# Patient Record
Sex: Male | Born: 1995 | Race: Black or African American | Hispanic: No | Marital: Single | Smoking: Never smoker
Health system: Southern US, Community
[De-identification: ages and names within clinical notes are randomized; demographics above are authoritative.]

## PROBLEM LIST (undated history)

## (undated) DIAGNOSIS — E119 Type 2 diabetes mellitus without complications: Secondary | ICD-10-CM

---
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
## 2018-04-01 ENCOUNTER — Emergency Department (HOSPITAL_COMMUNITY): Payer: 59

## 2018-04-01 ENCOUNTER — Other Ambulatory Visit: Payer: Self-pay

## 2018-04-01 ENCOUNTER — Ambulatory Visit: Payer: BLUE CROSS/BLUE SHIELD | Admitting: Physician Assistant

## 2018-04-01 ENCOUNTER — Inpatient Hospital Stay (HOSPITAL_COMMUNITY)
Admission: EM | Admit: 2018-04-01 | Discharge: 2018-04-09 | DRG: 439 | Disposition: A | Discharge status: Home / Self Care | Payer: 59 | Attending: Internal Medicine | Admitting: Internal Medicine

## 2018-04-01 ENCOUNTER — Encounter (HOSPITAL_COMMUNITY): Payer: Self-pay | Admitting: Obstetrics and Gynecology

## 2018-04-01 DIAGNOSIS — R1013 Epigastric pain: Secondary | ICD-10-CM | POA: Diagnosis not present

## 2018-04-01 DIAGNOSIS — R739 Hyperglycemia, unspecified: Secondary | ICD-10-CM

## 2018-04-01 DIAGNOSIS — E1065 Type 1 diabetes mellitus with hyperglycemia: Secondary | ICD-10-CM | POA: Diagnosis not present

## 2018-04-01 DIAGNOSIS — R809 Proteinuria, unspecified: Secondary | ICD-10-CM | POA: Diagnosis present

## 2018-04-01 DIAGNOSIS — K810 Acute cholecystitis: Secondary | ICD-10-CM | POA: Diagnosis not present

## 2018-04-01 DIAGNOSIS — E785 Hyperlipidemia, unspecified: Secondary | ICD-10-CM | POA: Diagnosis present

## 2018-04-01 DIAGNOSIS — E109 Type 1 diabetes mellitus without complications: Secondary | ICD-10-CM | POA: Diagnosis not present

## 2018-04-01 DIAGNOSIS — R9431 Abnormal electrocardiogram [ECG] [EKG]: Secondary | ICD-10-CM | POA: Diagnosis present

## 2018-04-01 DIAGNOSIS — N390 Urinary tract infection, site not specified: Secondary | ICD-10-CM | POA: Diagnosis present

## 2018-04-01 DIAGNOSIS — E781 Pure hyperglyceridemia: Secondary | ICD-10-CM | POA: Diagnosis present

## 2018-04-01 DIAGNOSIS — R110 Nausea: Secondary | ICD-10-CM | POA: Diagnosis not present

## 2018-04-01 DIAGNOSIS — K859 Acute pancreatitis without necrosis or infection, unspecified: Secondary | ICD-10-CM | POA: Diagnosis present

## 2018-04-01 DIAGNOSIS — Z79899 Other long term (current) drug therapy: Secondary | ICD-10-CM | POA: Diagnosis not present

## 2018-04-01 DIAGNOSIS — R509 Fever, unspecified: Secondary | ICD-10-CM

## 2018-04-01 DIAGNOSIS — E119 Type 2 diabetes mellitus without complications: Secondary | ICD-10-CM

## 2018-04-01 DIAGNOSIS — E1069 Type 1 diabetes mellitus with other specified complication: Secondary | ICD-10-CM

## 2018-04-01 DIAGNOSIS — R1011 Right upper quadrant pain: Secondary | ICD-10-CM | POA: Diagnosis not present

## 2018-04-01 DIAGNOSIS — R945 Abnormal results of liver function studies: Secondary | ICD-10-CM

## 2018-04-01 DIAGNOSIS — M609 Myositis, unspecified: Secondary | ICD-10-CM | POA: Diagnosis present

## 2018-04-01 DIAGNOSIS — R6510 Systemic inflammatory response syndrome (SIRS) of non-infectious origin without acute organ dysfunction: Secondary | ICD-10-CM

## 2018-04-01 DIAGNOSIS — R7989 Other specified abnormal findings of blood chemistry: Secondary | ICD-10-CM

## 2018-04-01 DIAGNOSIS — Z833 Family history of diabetes mellitus: Secondary | ICD-10-CM | POA: Diagnosis not present

## 2018-04-01 DIAGNOSIS — K858 Other acute pancreatitis without necrosis or infection: Secondary | ICD-10-CM | POA: Diagnosis not present

## 2018-04-01 DIAGNOSIS — M4056 Lordosis, unspecified, lumbar region: Secondary | ICD-10-CM | POA: Diagnosis not present

## 2018-04-01 LAB — CBC
HEMATOCRIT: 43.2 % (ref 39.0–52.0)
HEMOGLOBIN: 15.7 g/dL (ref 13.0–17.0)
MCH: 29.7 pg (ref 26.0–34.0)
MCHC: 35.8 g/dL (ref 30.0–36.0)
MCV: 83.9 fL (ref 78.0–100.0)
Platelets: 279 10*3/uL (ref 150–400)
RBC: 5.15 MIL/uL (ref 4.22–5.81)
RDW: 12.9 % (ref 11.5–15.5)
WBC: 15.7 10*3/uL — AB (ref 4.0–10.5)

## 2018-04-01 LAB — COMPREHENSIVE METABOLIC PANEL
ALT: 78 U/L — ABNORMAL HIGH (ref 0–44)
ANION GAP: 16 — AB (ref 5–15)
AST: 31 U/L (ref 15–41)
Albumin: 4.5 g/dL (ref 3.5–5.0)
Alkaline Phosphatase: 145 U/L — ABNORMAL HIGH (ref 38–126)
BUN: 6 mg/dL (ref 6–20)
CHLORIDE: 96 mmol/L — AB (ref 98–111)
CO2: 25 mmol/L (ref 22–32)
Calcium: 10.5 mg/dL — ABNORMAL HIGH (ref 8.9–10.3)
Creatinine, Ser: 0.78 mg/dL (ref 0.61–1.24)
Glucose, Bld: 339 mg/dL — ABNORMAL HIGH (ref 70–99)
POTASSIUM: 4 mmol/L (ref 3.5–5.1)
Sodium: 137 mmol/L (ref 135–145)
Total Bilirubin: 0.6 mg/dL (ref 0.3–1.2)
Total Protein: 8.2 g/dL — ABNORMAL HIGH (ref 6.5–8.1)

## 2018-04-01 LAB — URINALYSIS, ROUTINE W REFLEX MICROSCOPIC
Bilirubin Urine: NEGATIVE
Glucose, UA: >=500 mg/dL — AB
HGB URINE DIPSTICK: NEGATIVE
Ketones, ur: 80 mg/dL — AB
LEUKOCYTES UA: NEGATIVE
Nitrite: NEGATIVE
SPECIFIC GRAVITY, URINE: 1.044 — AB (ref 1.005–1.030)
pH: 6 (ref 5.0–8.0)

## 2018-04-01 LAB — I-STAT CG4 LACTIC ACID, ED: LACTIC ACID, VENOUS: 2.64 mmol/L — AB (ref 0.5–1.9)

## 2018-04-01 LAB — LIPASE, BLOOD: LIPASE: 109 U/L — AB (ref 11–51)

## 2018-04-01 LAB — CBG MONITORING, ED: Glucose-Capillary: 316 mg/dL — ABNORMAL HIGH (ref 70–99)

## 2018-04-01 MED ORDER — HYDROMORPHONE HCL 1 MG/ML IJ SOLN
1.0000 mg | INTRAMUSCULAR | Status: DC | PRN
  Administered 2018-04-02 – 2018-04-08 (×7): 1 mg via INTRAVENOUS
  Filled 2018-04-01 (×7): qty 1

## 2018-04-01 MED ORDER — SODIUM CHLORIDE 0.9 % IV SOLN
INTRAVENOUS | Status: AC
  Administered 2018-04-02 (×4): via INTRAVENOUS

## 2018-04-01 MED ORDER — ACETAMINOPHEN 650 MG RE SUPP: 650.0000 mg | Freq: Four times a day (QID) | RECTAL | Status: DC | PRN

## 2018-04-01 MED ORDER — ONDANSETRON HCL 4 MG/2ML IJ SOLN: 4.0000 mg | Freq: Four times a day (QID) | INTRAMUSCULAR | Status: DC | PRN

## 2018-04-01 MED ORDER — IOPAMIDOL (ISOVUE-300) INJECTION 61%
INTRAVENOUS | Status: AC
  Filled 2018-04-01: qty 100

## 2018-04-01 MED ORDER — ONDANSETRON HCL 4 MG/2ML IJ SOLN
4.0000 mg | Freq: Once | INTRAMUSCULAR | Status: AC
  Administered 2018-04-01: 4 mg via INTRAVENOUS
  Filled 2018-04-01: qty 2

## 2018-04-01 MED ORDER — ACETAMINOPHEN 325 MG PO TABS
650.0000 mg | ORAL_TABLET | Freq: Four times a day (QID) | ORAL | Status: DC | PRN
  Administered 2018-04-02 – 2018-04-09 (×10): 650 mg via ORAL
  Filled 2018-04-01 (×10): qty 2

## 2018-04-01 MED ORDER — SODIUM CHLORIDE 0.9 % IV SOLN
1.0000 g | INTRAVENOUS | Status: DC
  Administered 2018-04-02: 1 g via INTRAVENOUS
  Filled 2018-04-01: qty 10

## 2018-04-01 MED ORDER — IOPAMIDOL (ISOVUE-300) INJECTION 61%
100.0000 mL | Freq: Once | INTRAVENOUS | Status: AC | PRN
  Administered 2018-04-01: 100 mL via INTRAVENOUS

## 2018-04-01 MED ORDER — ENOXAPARIN SODIUM 40 MG/0.4ML ~~LOC~~ SOLN
40.0000 mg | SUBCUTANEOUS | Status: DC
  Administered 2018-04-02 – 2018-04-07 (×6): 40 mg via SUBCUTANEOUS
  Filled 2018-04-01 (×8): qty 0.4

## 2018-04-01 MED ORDER — SODIUM CHLORIDE 0.9 % IV BOLUS
1000.0000 mL | Freq: Once | INTRAVENOUS | Status: AC
  Administered 2018-04-01: 1000 mL via INTRAVENOUS

## 2018-04-01 MED ORDER — INSULIN ASPART 100 UNIT/ML ~~LOC~~ SOLN
0.0000 [IU] | SUBCUTANEOUS | Status: DC
  Administered 2018-04-02 (×2): 5 [IU] via SUBCUTANEOUS
  Administered 2018-04-02: 2 [IU] via SUBCUTANEOUS
  Administered 2018-04-02: 3 [IU] via SUBCUTANEOUS
  Administered 2018-04-02: 7 [IU] via SUBCUTANEOUS
  Administered 2018-04-02: 3 [IU] via SUBCUTANEOUS
  Administered 2018-04-03 (×2): 2 [IU] via SUBCUTANEOUS
  Administered 2018-04-03: 3 [IU] via SUBCUTANEOUS
  Administered 2018-04-03 (×2): 2 [IU] via SUBCUTANEOUS
  Administered 2018-04-03: 3 [IU] via SUBCUTANEOUS
  Administered 2018-04-04: 1 [IU] via SUBCUTANEOUS
  Administered 2018-04-04: 2 [IU] via SUBCUTANEOUS
  Administered 2018-04-04: 3 [IU] via SUBCUTANEOUS
  Administered 2018-04-05: 1 [IU] via SUBCUTANEOUS
  Administered 2018-04-05: 2 [IU] via SUBCUTANEOUS
  Administered 2018-04-05: 1 [IU] via SUBCUTANEOUS
  Administered 2018-04-05: 2 [IU] via SUBCUTANEOUS
  Administered 2018-04-05 – 2018-04-06 (×2): 1 [IU] via SUBCUTANEOUS
  Administered 2018-04-06: 2 [IU] via SUBCUTANEOUS
  Administered 2018-04-06: 1 [IU] via SUBCUTANEOUS
  Administered 2018-04-06 (×2): 2 [IU] via SUBCUTANEOUS
  Administered 2018-04-07: 1 [IU] via SUBCUTANEOUS
  Administered 2018-04-07 (×3): 2 [IU] via SUBCUTANEOUS
  Administered 2018-04-08 (×2): 1 [IU] via SUBCUTANEOUS
  Administered 2018-04-08: 2 [IU] via SUBCUTANEOUS
  Administered 2018-04-09: 1 [IU] via SUBCUTANEOUS
  Filled 2018-04-01: qty 1

## 2018-04-01 MED ORDER — MORPHINE SULFATE (PF) 4 MG/ML IV SOLN
4.0000 mg | Freq: Once | INTRAVENOUS | Status: AC
  Administered 2018-04-01: 4 mg via INTRAVENOUS
  Filled 2018-04-01: qty 1

## 2018-04-01 NOTE — ED Provider Notes (Signed)
[facility] DEPT Provider Note   CSN: [id number] Arrival date & time: 04/01/18  1738     History   Chief Complaint Chief Complaint  Patient presents with  . Abdominal Pain    HPI Alfred Li is a 22 y.o. male with no pmh here for evaluation of abdominal pain onset 2-3 days ago. Pain is crampy, intermittent, localized to above umbilicus.  Associated with sweats, chills, nausea, nbnb emesis x 1 today.  Went to UC and was given zofran, did not feel better and came to ER for evaluation. No hematemesis, diarrhea, constipation, dysuria, hematuria. No h/o abd surgeries. No ETOH or NSAID use. No h/o PUD.  Admits to recent dry mouth, polyuria, polydipsia.  No h/o diabetes. Aggravated with palpation and movements. No alleviating factors.    HPI  History reviewed. No pertinent past medical history.  There are no active problems to display for this patient.   History reviewed. No pertinent surgical history.      Home Medications    Prior to Admission medications   Medication Sig Start Date End Date Taking? Authorizing Provider  acetaminophen (TYLENOL) 500 MG tablet Take 1,000 mg by mouth every 6 (six) hours as needed for moderate pain.   Yes [provider]  loperamide (IMODIUM) 2 MG capsule Take 2 mg by mouth every 6 (six) hours as needed for diarrhea or loose stools.   Yes [provider]  ondansetron (ZOFRAN) 4 MG tablet Take 4 mg by mouth every 8 (eight) hours as needed for nausea or vomiting.   Yes [provider]  ranitidine (ZANTAC) 150 MG tablet Take 150 mg by mouth 2 (two) times daily.   Yes [provider]  amoxicillin (AMOXIL) 500 MG capsule Take 2 capsules (1,000 mg total) by mouth 2 (two) times daily. Patient not taking: Reported on 04/01/2018 12/22/15   Shawnee Knapp, MD  AYR SALINE NASAL NO-DRIP GEL Apply a small amount onto the lower septum of each nair every 2 hours Patient not taking: Reported on  04/01/2018 12/22/15   Shawnee Knapp, MD  mupirocin ointment (BACTROBAN) 2 % Apply 1 application topically 3 (three) times daily. Into each nare for 3 days Patient not taking: Reported on 04/01/2018 12/22/15   Shawnee Knapp, MD  oxymetazoline Melissa Montane NASAL SPRAY) 0.05 % nasal spray Use 3 sprays into nostrils as needed for nose bleed Patient not taking: Reported on 04/01/2018 12/22/15   Shawnee Knapp, MD  Pseudoephedrine-Guaifenesin [phone] MG TB12 Take 1 tablet by mouth 2 (two) times daily. Patient not taking: Reported on 04/01/2018 12/22/15   Shawnee Knapp, MD    Family History History reviewed. No pertinent family history.  Social History Social History   Tobacco Use  . Smoking status: Never Smoker  . Smokeless tobacco: Never Used  Substance Use Topics  . Alcohol use: No  . Drug use: No     Allergies   Patient has no known allergies.   Review of Systems Review of Systems  Constitutional: Positive for chills.  Gastrointestinal: Positive for abdominal pain, nausea and vomiting.  Endocrine: Positive for polydipsia and polyuria.  All other systems reviewed and are negative.    Physical Exam Updated Vital Signs BP 139/89   Pulse (!) 106   Temp 99.3 F (37.4 C) (Oral)   Resp 18   Ht _0  (1.778 m)   Wt 66.3 kg (146 lb 2 oz)   SpO2 99%   BMI 20.97 kg/m  Physical Exam  Constitutional: He is oriented to person, place, and time. He appears well-developed and well-nourished.  Non toxic.  HENT:  Head: Normocephalic and atraumatic.  Nose: Nose normal.  Moist mucous membranes   Eyes: Pupils are equal, round, and reactive to light. Conjunctivae and EOM are normal.  Neck: Normal range of motion.  Cardiovascular: Normal rate, regular rhythm, normal heart sounds and intact distal pulses.  2+ DP and radial pulses bilaterally.   Pulmonary/Chest: Effort normal and breath sounds normal.  Abdominal: Soft. Bowel sounds are normal. There is tenderness in the right upper quadrant, epigastric area  and periumbilical area. There is guarding.  Positive Murphy's. TTP to RUQ, epigastric and periumbilical regions with guarding. Active BS. No distention. No rigidity. No suprapubic or CVA tenderness.   Musculoskeletal: Normal range of motion.  Neurological: He is alert and oriented to person, place, and time.  Skin: Skin is warm and dry. Capillary refill takes less than 2 seconds.  Psychiatric: He has a normal mood and affect. His behavior is normal. Judgment and thought content normal.  Nursing note and vitals reviewed.    ED Treatments / Results  Labs (all labs ordered are listed, but only abnormal results are displayed) Labs Reviewed  LIPASE, BLOOD - Abnormal; Notable for the following components:      Result Value   Lipase 109 (*)    All other components within normal limits  COMPREHENSIVE METABOLIC PANEL - Abnormal; Notable for the following components:   Chloride 96 (*)    Glucose, Bld 339 (*)    Calcium 10.5 (*)    Total Protein 8.2 (*)    ALT 78 (*)    Alkaline Phosphatase 145 (*)    Anion gap 16 (*)    All other components within normal limits  CBC - Abnormal; Notable for the following components:   WBC 15.7 (*)    All other components within normal limits  URINALYSIS, ROUTINE W REFLEX MICROSCOPIC - Abnormal; Notable for the following components:   Specific Gravity, Urine 1.044 (*)    Glucose, UA >=500 (*)    Ketones, ur 80 (*)    Protein, ur >=300 (*)    Bacteria, UA RARE (*)    All other components within normal limits  I-STAT CG4 LACTIC ACID, ED - Abnormal; Notable for the following components:   Lactic Acid, Venous 2.64 (*)    All other components within normal limits  HEMOGLOBIN A1C  I-STAT CG4 LACTIC ACID, ED    EKG None  Radiology Ct Abdomen Pelvis W Contrast  Result Date: 04/01/2018 CLINICAL DATA:  Initial evaluation for acute right upper quadrant pain, fever. EXAM: CT ABDOMEN AND PELVIS WITH CONTRAST TECHNIQUE: Multidetector CT imaging of the abdomen  and pelvis was performed using the standard protocol following bolus administration of intravenous contrast. CONTRAST:  152m ISOVUE-300 IOPAMIDOL (ISOVUE-300) INJECTION 61% COMPARISON:  None. FINDINGS: Lower chest: Visualized lungs are clear. Hepatobiliary: Liver demonstrates a normal contrast enhanced appearance. Gallbladder within normal limits. No biliary dilatation. Pancreas: Hazy inflammatory stranding about the head and uncinate process of the pancreas, suggesting acute pancreatitis. No loculated peripancreatic collection or other complication identified. Spleen: Unremarkable. Adrenals/Urinary Tract: Adrenal glands are normal. Kidneys equal in size with symmetric enhancement. No nephrolithiasis, hydronephrosis, or focal enhancing renal mass. No hydroureter. Bladder within normal limits. Stomach/Bowel: Stomach within normal limits. Inflammatory stranding surrounds the duodenum due to the adjacent inflammatory process within the pancreas. No evidence for bowel obstruction. Normal appendix. No other acute inflammatory changes  seen about the bowels. Vascular/Lymphatic: Normal intravascular enhancement seen throughout the intra-abdominal aorta and its branch vessels. No adenopathy. Reproductive: Prostate normal. Other: No free air. Small volume free fluid within the upper abdomen related to the acute inflammatory changes about the pancreas. Musculoskeletal: No acute osseus abnormality. No worrisome lytic or blastic osseous lesions. IMPRESSION: 1. Findings consistent with acute pancreatitis. No complication identified. 2. No other acute abnormality within the abdomen and pelvis. Electronically Signed   By: Jeannine Boga M.D.   On: 04/01/2018 22:07    Procedures Procedures (including critical care time)  Medications Ordered in ED Medications  morphine 4 MG/ML injection 4 mg (4 mg Intravenous Given 04/01/18 2053)  ondansetron (ZOFRAN) injection 4 mg (4 mg Intravenous Given 04/01/18 2053)  sodium  chloride 0.9 % bolus 1,000 mL (0 mLs Intravenous Stopped 04/01/18 2240)  iopamidol (ISOVUE-300) 61 % injection 100 mL (100 mLs Intravenous Contrast Given 04/01/18 2146)     Initial Impression / Assessment and Plan / ED Course  I have reviewed the triage vital signs and the nursing notes.  Pertinent labs & imaging results that were available during my care of the patient were reviewed by me and considered in my medical decision making (see chart for details).  Clinical Course as of Apr 01 2302  Fri Apr 01, 2018  2030 WBC(!): 15.7 [CG]  2031 Lipase(!): 109 [CG]  2031 Glucose(!): 339 [CG]  2031 Anion gap(!): 16 [CG]  2031 Alkaline Phosphatase(!): 145 [CG]  2031 ALT(!): 78 [CG]  2031 Ketones, ur(!): 80 [CG]  2031 Protein(!): >=300 [CG]  2031 WBC, UA: 21-50 [CG]  2250 Lactic Acid, Venous(!!): 2.64 [CG]  2250 IMPRESSION: 1. Findings consistent with acute pancreatitis. No complication identified. 2. No other acute abnormality within the abdomen and pelvis.    CT ABDOMEN PELVIS W CONTRAST [CG]    Clinical Course User Index [CG] Kinnie Feil, PA-C   22 year old healthy male here with epigastric/right upper quadrant tenderness, nausea, vomiting, chills.  Admits to having cottonmouth, polyuria, polydipsia for several weeks.  No known history of diabetes.  Work-up remarkable for WBC 15.7, lipase 109, hypoglycemia with anion gap of 16 and bicarb 25, normal potassium.  ALT 78, alk phos 145.  Glucosuria, proteinuria, ketonuria.  CT A/P confirms acute pancreatitis without complication.  Gallbladder WNL.  Patient given morphine, Zofran, IV fluids.  Will not keep n.p.o.  Will request admission for pancreatitis, possible new onset diabetes. HA1c pending. Discussed plan with pt and mother who are in agreement.   Final Clinical Impressions(s) / ED Diagnoses   2300: Spoke to Dr. Maudie Mercury who will admit patient.  Final diagnoses:  Acute pancreatitis without infection or necrosis, unspecified  pancreatitis type  Hyperglycemia    ED Discharge Orders    None       Arlean Hopping 04/01/18 2304    Davonna Belling, MD 04/02/18 0040

## 2018-04-01 NOTE — H&P (Signed)
[facility] H&P   Patient Demographics:    Alfred Li, is a 22 y.o. male  MRN: [id number]   DOB - 1995/10/26  Admit Date - 04/01/2018  Outpatient Primary MD for the patient is Patient, No Pcp Per  Referring MD/NP/PA:  Carmon Sails  Outpatient Specialists:    Patient coming from: home  Chief Complaint  Patient presents with  . Abdominal Pain      HPI:    Alfred Li  is a 22 y.o. male, w family hx of diabetes, apparently presents with 4 days of epigastric pain.  Pt denies fever, chills, cp, palp, sob, diarrhea, brbpr.  Pt was seen at [facility] urgent care and tx with zantac earlier today but his abdominal pain worsened and therefore presented to ED for evaluation.  Notes n/v x1 today.  No bloody emesis.    In ED,   CT abd/ pelvis IMPRESSION: 1. Findings consistent with acute pancreatitis. No complication identified. 2. No other acute abnormality within the abdomen and pelvis.  Lipase 109  Glucose 339 Bun 6, Creatinine 0.78 Ast 31, Alt 78, Alk phos 145, T. Bili 0.6 Wbc 15.7, Hgb 15.7, Plt 279  Urinalysis wbc 21-50, rbc 0-5  Pt will be admitted for UTI, pancreatitis and new onset diabetes.     Review of systems:    In addition to the HPI above,  No Fever-chills, No Headache, No changes with Vision or hearing, No problems swallowing food or Liquids, No Chest pain, Cough or Shortness of Breath, No Blood in stool or Urine, No dysuria, No new skin rashes or bruises, No new joints pains-aches,  No new weakness, tingling, numbness in any extremity, No recent weight gain or loss, No polyuria, polydypsia or polyphagia, No significant Mental Stressors.  A full 10 point Review of Systems was done, except as stated above, all other Review of Systems were negative.   With Past History of the following :    History reviewed. No pertinent past medical history.    None per pt History reviewed. No pertinent surgical history. None per pt   Social History:     Social History   Tobacco Use  . Smoking status: Never Smoker  . Smokeless tobacco: Never Used  Substance Use Topics  . Alcohol use: No     Lives - at home  Mobility - walks by self   Family History :    History reviewed. No pertinent family history. + family history of diabetes    Home Medications:   Prior to Admission medications   Medication Sig Start Date End Date Taking? Authorizing Provider  acetaminophen (TYLENOL) 500 MG tablet Take 1,000 mg by mouth every 6 (six) hours as needed for moderate pain.   Yes [provider]  loperamide (IMODIUM) 2 MG capsule Take 2 mg by mouth every 6 (six) hours as needed for diarrhea or loose stools.  Yes [provider]  ondansetron (ZOFRAN) 4 MG tablet Take 4 mg by mouth every 8 (eight) hours as needed for nausea or vomiting.   Yes [provider]  ranitidine (ZANTAC) 150 MG tablet Take 150 mg by mouth 2 (two) times daily.   Yes [provider]  amoxicillin (AMOXIL) 500 MG capsule Take 2 capsules (1,000 mg total) by mouth 2 (two) times daily. Patient not taking: Reported on 04/01/2018 12/22/15   Shawnee Knapp, MD  AYR SALINE NASAL NO-DRIP GEL Apply a small amount onto the lower septum of each nair every 2 hours Patient not taking: Reported on 04/01/2018 12/22/15   Shawnee Knapp, MD  mupirocin ointment (BACTROBAN) 2 % Apply 1 application topically 3 (three) times daily. Into each nare for 3 days Patient not taking: Reported on 04/01/2018 12/22/15   Shawnee Knapp, MD  oxymetazoline Melissa Montane NASAL SPRAY) 0.05 % nasal spray Use 3 sprays into nostrils as needed for nose bleed Patient not taking: Reported on 04/01/2018 12/22/15   Shawnee Knapp, MD  Pseudoephedrine-Guaifenesin [phone] MG TB12 Take 1 tablet by mouth 2 (two) times daily. Patient not taking: Reported on 04/01/2018 12/22/15   Shawnee Knapp, MD     Allergies:    No  Known Allergies   Physical Exam:   Vitals  Blood pressure 139/89, pulse (!) 106, temperature 99.3 F (37.4 C), temperature source Oral, resp. rate 18, height _0  (1.778 m), weight 66.3 kg (146 lb 2 oz), SpO2 99 %.   1. General  lying in bed in NAD,   2. Normal affect and insight, Not Suicidal or Homicidal, Awake Alert, Oriented X 3.  3. No F.N deficits, ALL C.Nerves Intact, Strength 5/5 all 4 extremities, Sensation intact all 4 extremities, Plantars down going.  4. Ears and Eyes appear Normal, Conjunctivae clear, PERRLA. Moist Oral Mucosa.  5. Supple Neck, No JVD, No cervical lymphadenopathy appriciated, No Carotid Bruits.  6. Symmetrical Chest wall movement, Good air movement bilaterally, CTAB.  7. RRR, No Gallops, Rubs or Murmurs, No Parasternal Heave.  8. Positive Bowel Sounds, Abdomen Soft, No tenderness, No organomegaly appriciated,No rebound -guarding or rigidity.  9.  No Cyanosis, Normal Skin Turgor, No Skin Rash or Bruise.  10. Good muscle tone,  joints appear normal , no effusions, Normal ROM.  11. No Palpable Lymph Nodes in Neck or Axillae      Data Review:    CBC Recent Labs  Lab 04/01/18 1803  WBC 15.7*  HGB 15.7  HCT 43.2  PLT 279  MCV 83.9  MCH 29.7  MCHC 35.8  RDW 12.9   ------------------------------------------------------------------------------------------------------------------  Chemistries  Recent Labs  Lab 04/01/18 1803  NA 137  K 4.0  CL 96*  CO2 25  GLUCOSE 339*  BUN 6  CREATININE 0.78  CALCIUM 10.5*  AST 31  ALT 78*  ALKPHOS 145*  BILITOT 0.6   ------------------------------------------------------------------------------------------------------------------ estimated creatinine clearance is 139.3 mL/min (by C-G formula based on SCr of 0.78 mg/dL). ------------------------------------------------------------------------------------------------------------------ No results for input(s): TSH, T4TOTAL, T3FREE, THYROIDAB  in the last 72 hours.  Invalid input(s): FREET3  Coagulation profile No results for input(s): INR, PROTIME in the last 168 hours. ------------------------------------------------------------------------------------------------------------------- No results for input(s): DDIMER in the last 72 hours. -------------------------------------------------------------------------------------------------------------------  Cardiac Enzymes No results for input(s): CKMB, TROPONINI, MYOGLOBIN in the last 168 hours.  Invalid input(s): CK ------------------------------------------------------------------------------------------------------------------ No results found for: BNP   ---------------------------------------------------------------------------------------------------------------  Urinalysis    Component Value Date/Time   COLORURINE YELLOW  04/01/2018 1911   APPEARANCEUR CLEAR 04/01/2018 1911   LABSPEC 1.044 (H) 04/01/2018 1911   PHURINE 6.0 04/01/2018 1911   GLUCOSEU >=500 (A) 04/01/2018 1911   HGBUR NEGATIVE 04/01/2018 1911   BILIRUBINUR NEGATIVE 04/01/2018 1911   KETONESUR 80 (A) 04/01/2018 1911   PROTEINUR >=300 (A) 04/01/2018 1911   UROBILINOGEN 0.2 04/08/2011 1831   NITRITE NEGATIVE 04/01/2018 1911   LEUKOCYTESUR NEGATIVE 04/01/2018 1911    ----------------------------------------------------------------------------------------------------------------   Imaging Results:    Ct Abdomen Pelvis W Contrast  Result Date: 04/01/2018 CLINICAL DATA:  Initial evaluation for acute right upper quadrant pain, fever. EXAM: CT ABDOMEN AND PELVIS WITH CONTRAST TECHNIQUE: Multidetector CT imaging of the abdomen and pelvis was performed using the standard protocol following bolus administration of intravenous contrast. CONTRAST:  17m ISOVUE-300 IOPAMIDOL (ISOVUE-300) INJECTION 61% COMPARISON:  None. FINDINGS: Lower chest: Visualized lungs are clear. Hepatobiliary: Liver demonstrates a  normal contrast enhanced appearance. Gallbladder within normal limits. No biliary dilatation. Pancreas: Hazy inflammatory stranding about the head and uncinate process of the pancreas, suggesting acute pancreatitis. No loculated peripancreatic collection or other complication identified. Spleen: Unremarkable. Adrenals/Urinary Tract: Adrenal glands are normal. Kidneys equal in size with symmetric enhancement. No nephrolithiasis, hydronephrosis, or focal enhancing renal mass. No hydroureter. Bladder within normal limits. Stomach/Bowel: Stomach within normal limits. Inflammatory stranding surrounds the duodenum due to the adjacent inflammatory process within the pancreas. No evidence for bowel obstruction. Normal appendix. No other acute inflammatory changes seen about the bowels. Vascular/Lymphatic: Normal intravascular enhancement seen throughout the intra-abdominal aorta and its branch vessels. No adenopathy. Reproductive: Prostate normal. Other: No free air. Small volume free fluid within the upper abdomen related to the acute inflammatory changes about the pancreas. Musculoskeletal: No acute osseus abnormality. No worrisome lytic or blastic osseous lesions. IMPRESSION: 1. Findings consistent with acute pancreatitis. No complication identified. 2. No other acute abnormality within the abdomen and pelvis. Electronically Signed   By: BJeannine BogaM.D.   On: 04/01/2018 22:07       Assessment & Plan:    Active Problems:   Pancreatitis    Abdominal pain, Pancreatitis Check LDH, check Lipid Check UDS NPO Ns iv Dilaudid 114miv q4h prn  Protonix 4044mo qday Check cbc, cmp, lipase in am  N/v Zofran 4mg8m q6h prn   Diabetes type 1 fsbs q4h, ISS  Abnormal liver function Check acute hepatitis panel Check cmp in am  UTI Await urine culture Rocephin 1gm iv qday    DVT Prophylaxis Lovenox - SCDs  AM Labs Ordered, also please review Full Orders  Family Communication: Admission,  patients condition and plan of care including tests being ordered have been discussed with the patient  who indicate understanding and agree with the plan and Code Status.  Code Status FULL CODE  Likely DC to  home  Condition GUARDED    Consults called: none  Admission status:  inpatient   Time spent in minutes : 60   JameJani Gravel on 04/01/2018 at 11:30 PM  Between 7am to 7pm - Pager - [phone] 7pm go to [email or web address] - password [email or web address] - Office  [phone]

## 2018-04-01 NOTE — ED Notes (Signed)
Admit MD at bedside

## 2018-04-01 NOTE — ED Notes (Signed)
Notified PA, Claudia pt. I-stat CG4 Lactic acid 2.64 and RN,Sydney made aware.

## 2018-04-01 NOTE — ED Triage Notes (Signed)
Per Pt: Pt reports stomach pain and cramping in his umbilicus region. Pt reports N/V/D. Pt reports a decrease in appetite.

## 2018-04-02 ENCOUNTER — Encounter (HOSPITAL_COMMUNITY): Payer: Self-pay | Admitting: *Deleted

## 2018-04-02 DIAGNOSIS — R945 Abnormal results of liver function studies: Secondary | ICD-10-CM

## 2018-04-02 DIAGNOSIS — K858 Other acute pancreatitis without necrosis or infection: Secondary | ICD-10-CM

## 2018-04-02 DIAGNOSIS — E119 Type 2 diabetes mellitus without complications: Secondary | ICD-10-CM

## 2018-04-02 DIAGNOSIS — R7989 Other specified abnormal findings of blood chemistry: Secondary | ICD-10-CM

## 2018-04-02 DIAGNOSIS — E781 Pure hyperglyceridemia: Secondary | ICD-10-CM

## 2018-04-02 DIAGNOSIS — R6510 Systemic inflammatory response syndrome (SIRS) of non-infectious origin without acute organ dysfunction: Secondary | ICD-10-CM

## 2018-04-02 LAB — COMPREHENSIVE METABOLIC PANEL
ALBUMIN: 4.1 g/dL (ref 3.5–5.0)
ALT: 62 U/L — ABNORMAL HIGH (ref 0–44)
AST: 22 U/L (ref 15–41)
Alkaline Phosphatase: 125 U/L (ref 38–126)
Anion gap: 14 (ref 5–15)
BUN: <5 mg/dL — ABNORMAL LOW (ref 6–20)
CO2: 24 mmol/L (ref 22–32)
Calcium: 9.7 mg/dL (ref 8.9–10.3)
Chloride: 98 mmol/L (ref 98–111)
Creatinine, Ser: 0.71 mg/dL (ref 0.61–1.24)
GFR calc Af Amer: >60 mL/min (ref >60)
GFR calc non Af Amer: >60 mL/min (ref >60)
GLUCOSE: 298 mg/dL — AB (ref 70–99)
POTASSIUM: 4.1 mmol/L (ref 3.5–5.1)
Sodium: 136 mmol/L (ref 135–145)
Total Bilirubin: 0.4 mg/dL (ref 0.3–1.2)
Total Protein: 7.6 g/dL (ref 6.5–8.1)

## 2018-04-02 LAB — GLUCOSE, CAPILLARY
GLUCOSE-CAPILLARY: 265 mg/dL — AB (ref 70–99)
GLUCOSE-CAPILLARY: 220 mg/dL — AB (ref 70–99)
Glucose-Capillary: 267 mg/dL — ABNORMAL HIGH (ref 70–99)
Glucose-Capillary: 220 mg/dL — ABNORMAL HIGH (ref 70–99)
Glucose-Capillary: 186 mg/dL — ABNORMAL HIGH (ref 70–99)

## 2018-04-02 LAB — LIPID PANEL
Cholesterol: 935 mg/dL — ABNORMAL HIGH (ref 0–200)
LDL Cholesterol: UNDETERMINED mg/dL (ref 0–99)
Triglycerides: >5000 mg/dL — ABNORMAL HIGH (ref <150)
VLDL: UNDETERMINED mg/dL (ref 0–40)

## 2018-04-02 LAB — CBC
HCT: 42.8 % (ref 39.0–52.0)
Hemoglobin: 15.2 g/dL (ref 13.0–17.0)
MCH: 29.5 pg (ref 26.0–34.0)
MCHC: 35.5 g/dL (ref 30.0–36.0)
MCV: 82.9 fL (ref 78.0–100.0)
Platelets: 269 10*3/uL (ref 150–400)
RBC: 5.16 MIL/uL (ref 4.22–5.81)
RDW: 13.1 % (ref 11.5–15.5)
WBC: 14.6 10*3/uL — ABNORMAL HIGH (ref 4.0–10.5)

## 2018-04-02 LAB — RAPID URINE DRUG SCREEN, HOSP PERFORMED
AMPHETAMINES: NOT DETECTED
BENZODIAZEPINES: NOT DETECTED
Barbiturates: NOT DETECTED
Cocaine: NOT DETECTED
OPIATES: POSITIVE — AB
Tetrahydrocannabinol: NOT DETECTED

## 2018-04-02 LAB — HEMOGLOBIN A1C
Hgb A1c MFr Bld: 13.9 % — ABNORMAL HIGH (ref 4.8–5.6)
Mean Plasma Glucose: 352.23 mg/dL

## 2018-04-02 LAB — LIPASE, BLOOD: LIPASE: 86 U/L — AB (ref 11–51)

## 2018-04-02 LAB — HIV ANTIBODY (ROUTINE TESTING W REFLEX): HIV Screen 4th Generation wRfx: NONREACTIVE

## 2018-04-02 LAB — LACTATE DEHYDROGENASE: LDH: 154 U/L (ref 98–192)

## 2018-04-02 MED ORDER — INSULIN GLARGINE 100 UNIT/ML ~~LOC~~ SOLN
20.0000 [IU] | Freq: Every day | SUBCUTANEOUS | Status: DC
  Administered 2018-04-02: 20 [IU] via SUBCUTANEOUS
  Filled 2018-04-02 (×2): qty 0.2

## 2018-04-02 MED ORDER — ATORVASTATIN CALCIUM 40 MG PO TABS
40.0000 mg | ORAL_TABLET | Freq: Every day | ORAL | Status: DC
Start: 2018-04-02 — End: 2018-04-08
  Administered 2018-04-02 – 2018-04-07 (×6): 40 mg via ORAL
  Filled 2018-04-02 (×6): qty 1

## 2018-04-02 MED ORDER — INSULIN STARTER KIT- PEN NEEDLES (ENGLISH)
1.0000 | Freq: Once | Status: AC
  Administered 2018-04-02: 1
  Filled 2018-04-02: qty 1

## 2018-04-02 MED ORDER — GEMFIBROZIL 600 MG PO TABS
600.0000 mg | ORAL_TABLET | Freq: Two times a day (BID) | ORAL | Status: DC
  Administered 2018-04-02 – 2018-04-09 (×15): 600 mg via ORAL
  Filled 2018-04-02 (×16): qty 1

## 2018-04-02 MED ORDER — OMEGA-3-ACID ETHYL ESTERS 1 G PO CAPS
1.0000 g | ORAL_CAPSULE | Freq: Two times a day (BID) | ORAL | Status: DC
  Administered 2018-04-02 – 2018-04-09 (×15): 1 g via ORAL
  Filled 2018-04-02 (×15): qty 1

## 2018-04-02 MED ORDER — LIVING WELL WITH DIABETES BOOK
Freq: Once | Status: AC
Start: 2018-04-02 — End: 2018-04-02
  Administered 2018-04-02: 13:00:00
  Filled 2018-04-02: qty 1

## 2018-04-02 NOTE — Progress Notes (Addendum)
Inpatient Diabetes Program Recommendations  AACE/ADA: New Consensus Statement on Inpatient Glycemic Control (2015)  Target Ranges:  Prepandial:   less than 140 mg/dL      Peak postprandial:   less than 180 mg/dL (1-2 hours)      Critically ill patients:  140 - 180 mg/dL   Lab Results  Component Value Date   GLUCAP 316 (H) 04/01/2018   HGBA1C 13.9 (H) 04/01/2018    Review of Glycemic Control Results for Alfred Li, Alfred Li (MRN [id number]) as of 04/02/2018 10:40  Ref. Range 04/01/2018 23:24  Glucose-Capillary Latest Ref Range: 70 - 99 mg/dL 316 (H)   Diabetes history: new onset Outpatient Diabetes medications: none Current orders for Inpatient glycemic control:  Novolog 0-9 units Q4H, Lantus 20 units QD  Inpatient Diabetes Program Recommendations:    Noted addition of basal insulin. Will watch for trends.  Spoke with mother and patient by phone regarding new diagnosis of DM.   Reviewed patient's current A1c of 13.9%. Explained what a A1c is and what it measures. Also reviewed goal A1c with patient, importance of good glucose control @ home, and blood sugar goals. Reviewed patho of DM, need for insulin, role of pancreas, beta cell function, differences between short acting insulin vs long acting insulin, vascular changes that occur from poor control and long term comorbidites. Reviewed signs and symptoms of hypo vs hyper glycemia and interventions based on 15-15 rule.   Patient will need a meter and testing supplies at discharge.  Encouraged to check BS 3-4 times a day or when symptomatic.   Placed consult for dietitian to assist with carb counting. Ordered LWWDM and encouraged family to review together and write down questions. Educated on CGM vs meter kits. Discussed co-pay cards. Plan to provide on Monday. Ordered insulin pen kit and will need additional instruction for self injection. Patient will need a PCP. Encouraged to make an appointment and reach out on Monday prior to DC, and  educated on role of endocrinologist as an additional resource. Patient's mother plans to reach out regarding appointments for follow up. Outpatient education referral placed.  Spoke with RN to begin assisting patient with CBGs and self injections. She plans to continue with additional education and review survival skills throughout the weekend. At this time patient and family have no further questions.   Thanks, Bronson Curb, MSN, RNC-OB Diabetes Coordinator [phone] (8a-5p)

## 2018-04-02 NOTE — Plan of Care (Signed)
VSS, patients pain controlled with Dilaudid at this time, no nausea or vomiting.  Mother at bedside.  Beginning education provided to mother and patient regarding diabetes, short acting and long acting insulin, checking blood sugars at home and pancreatitis management.  Patient sleepy d/t pain medications at this time so most of teaching occurring with mother.

## 2018-04-02 NOTE — Progress Notes (Signed)
PROGRESS NOTE    Alfred Li  QQP:[id number] DOB: Jan 15, 1996 DOA: 04/01/2018 PCP: Patient, No Pcp Per     Brief Narrative:  Alfred Li is a 22 year old male with no previous medical history who presents with 4 days history of epigastric pain.  He admits to progressive pain that worsened yesterday.  One episode of vomiting.  He also admits to polydipsia and polyuria over the past few months.  In the emergency department, CT abdomen pelvis revealed findings consistent with acute pancreatitis.  Triglycerides were over 5000, hemoglobin A1c 13.9.  New events last 24 hours / Subjective: No issues overnight.  Using Dilaudid for pain control.  No nausea or vomiting.  Continues to have epigastric pain that he describes as a soreness.  Assessment & Plan:   Principal Problem:   Pancreatitis Active Problems:   SIRS (systemic inflammatory response syndrome) (HCC)   High triglycerides   Diabetes (HCC)   LFT elevation  SIRS secondary to acute pancreatitis, pancreatitis secondary to elevated triglycerides -Continue supportive care, NPO, IV fluid, pain control -CT A/P without gallstones. Denies alcohol use.  -Gemfibrozil, Lipitor, omega-3 ordered for triglyceridemia >5000  New diagnosis diabetes, uncontrolled with hyperglycemia -Hemoglobin A1c 13.9 -Diabetic coordinator consulted -Discussed with mom and patient that patient will need insulin on discharge home -Likely type 1 diabetes in this young 22 yo patient with BMI 21. Check C-peptide, glutamic acid antibody, Proinsulin -Sliding scale insulin. Add lantus   Elevated LFT -Improving, likely due to pancreatitis -Hepatitis panel pending   Doubt UTI -UA negative for leukocytes, nitrites, rare bacteria. Urine culture pending -Stop Rocephin  Abnormal EKG -EKGs reviewed independently. Sinus tachycardia with peaked T waves anterior leads and questionable T wave elevation lateral leads. Repeat EKG this morning with sinus  tachycardia, J-point elevation without significant ST elevation.  -No chest pain. Monitor on telemetry    DVT prophylaxis: Loveox Code Status: Full Family Communication: Mother at bedside Disposition Plan: Pending improvement   Consultants:   None  Procedures:   None   Antimicrobials:  Anti-infectives (From admission, onward)   Start     Dose/Rate Route Frequency Ordered Stop   04/01/18 2315  cefTRIAXone (ROCEPHIN) 1 g in sodium chloride 0.9 % 100 mL IVPB  Status:  Discontinued     1 g 200 mL/hr over 30 Minutes Intravenous Every 24 hours 04/01/18 2312 04/02/18 0727       Objective: Vitals:   04/02/18 0132 04/02/18 0557 04/02/18 0626 04/02/18 0653  BP: 127/73 129/75    Pulse: (!) 111 (!) 132 (!) 124   Resp: 16 16    Temp: 99.4 F (37.4 C) (!) 101.3 F (38.5 C)  99.9 F (37.7 C)  TempSrc: Oral   Oral  SpO2: 98% 97%    Weight:  67.7 kg (149 lb 4 oz)    Height:        Intake/Output Summary (Last 24 hours) at 04/02/2018 1059 Last data filed at 04/02/2018 0605 Gross per 24 hour  Intake 2063.33 ml  Output 550 ml  Net 1513.33 ml   Filed Weights   04/01/18 1750 04/02/18 0557  Weight: 66.3 kg (146 lb 2 oz) 67.7 kg (149 lb 4 oz)    Examination:  General exam: Appears calm and comfortable  Respiratory system: Clear to auscultation. Respiratory effort normal. Cardiovascular system: S1 & S2 heard, RRR. No JVD, murmurs, rubs, gallops or clicks. No pedal edema. Gastrointestinal system: Abdomen is nondistended, soft and TTP epigastric. No organomegaly or masses felt. Normal bowel  sounds heard. Central nervous system: Alert and oriented. No focal neurological deficits. Extremities: Symmetric 5 x 5 power. Skin: No rashes, lesions or ulcers Psychiatry: Judgement and insight appear normal. Mood & affect appropriate.   Data Reviewed: I have personally reviewed following labs and imaging studies  CBC: Recent Labs  Lab 04/01/18 1803 04/02/18 0124  WBC 15.7* 14.6*  HGB  15.7 15.2  HCT 43.2 42.8  MCV 83.9 82.9  PLT 279 967   Basic Metabolic Panel: Recent Labs  Lab 04/01/18 1803 04/02/18 0124  NA 137 136  K 4.0 4.1  CL 96* 98  CO2 25 24  GLUCOSE 339* 298*  BUN 6 <5*  CREATININE 0.78 0.71  CALCIUM 10.5* 9.7   GFR: Estimated Creatinine Clearance: 142.2 mL/min (by C-G formula based on SCr of 0.71 mg/dL). Liver Function Tests: Recent Labs  Lab 04/01/18 1803 04/02/18 0124  AST 31 22  ALT 78* 62*  ALKPHOS 145* 125  BILITOT 0.6 0.4  PROT 8.2* 7.6  ALBUMIN 4.5 4.1   Recent Labs  Lab 04/01/18 1803 04/02/18 0124  LIPASE 109* 86*   No results for input(s): AMMONIA in the last 168 hours. Coagulation Profile: No results for input(s): INR, PROTIME in the last 168 hours. Cardiac Enzymes: No results for input(s): CKTOTAL, CKMB, CKMBINDEX, TROPONINI in the last 168 hours. BNP (last 3 results) No results for input(s): PROBNP in the last 8760 hours. HbA1C: Recent Labs    04/01/18 2047  HGBA1C 13.9*   CBG: Recent Labs  Lab 04/01/18 2324  GLUCAP 316*   Lipid Profile: Recent Labs    04/02/18 0124  CHOL 935*  HDL NOT REPORTED DUE TO HIGH TRIGLYCERIDES  LDLCALC UNABLE TO CALCULATE IF TRIGLYCERIDE OVER 400 mg/dL  TRIG >5,000*  CHOLHDL NOT REPORTED DUE TO HIGH TRIGLYCERIDES   Thyroid Function Tests: No results for input(s): TSH, T4TOTAL, FREET4, T3FREE, THYROIDAB in the last 72 hours. Anemia Panel: No results for input(s): VITAMINB12, FOLATE, FERRITIN, TIBC, IRON, RETICCTPCT in the last 72 hours. Sepsis Labs: Recent Labs  Lab 04/01/18 2113  LATICACIDVEN 2.64*    No results found for this or any previous visit (from the past 240 hour(s)).     Radiology Studies: Ct Abdomen Pelvis W Contrast  Result Date: 04/01/2018 CLINICAL DATA:  Initial evaluation for acute right upper quadrant pain, fever. EXAM: CT ABDOMEN AND PELVIS WITH CONTRAST TECHNIQUE: Multidetector CT imaging of the abdomen and pelvis was performed using the standard  protocol following bolus administration of intravenous contrast. CONTRAST:  121m ISOVUE-300 IOPAMIDOL (ISOVUE-300) INJECTION 61% COMPARISON:  None. FINDINGS: Lower chest: Visualized lungs are clear. Hepatobiliary: Liver demonstrates a normal contrast enhanced appearance. Gallbladder within normal limits. No biliary dilatation. Pancreas: Hazy inflammatory stranding about the head and uncinate process of the pancreas, suggesting acute pancreatitis. No loculated peripancreatic collection or other complication identified. Spleen: Unremarkable. Adrenals/Urinary Tract: Adrenal glands are normal. Kidneys equal in size with symmetric enhancement. No nephrolithiasis, hydronephrosis, or focal enhancing renal mass. No hydroureter. Bladder within normal limits. Stomach/Bowel: Stomach within normal limits. Inflammatory stranding surrounds the duodenum due to the adjacent inflammatory process within the pancreas. No evidence for bowel obstruction. Normal appendix. No other acute inflammatory changes seen about the bowels. Vascular/Lymphatic: Normal intravascular enhancement seen throughout the intra-abdominal aorta and its branch vessels. No adenopathy. Reproductive: Prostate normal. Other: No free air. Small volume free fluid within the upper abdomen related to the acute inflammatory changes about the pancreas. Musculoskeletal: No acute osseus abnormality. No worrisome lytic or blastic osseous  lesions. IMPRESSION: 1. Findings consistent with acute pancreatitis. No complication identified. 2. No other acute abnormality within the abdomen and pelvis. Electronically Signed   By: Jeannine Boga M.D.   On: 04/01/2018 22:07      Scheduled Meds: . atorvastatin  40 mg Oral q1800  . enoxaparin (LOVENOX) injection  40 mg Subcutaneous Q24H  . gemfibrozil  600 mg Oral BID AC  . insulin aspart  0-9 Units Subcutaneous Q4H  . insulin glargine  20 Units Subcutaneous Daily  . insulin starter kit- pen needles  1 kit Other Once    . living well with diabetes book   Does not apply Once  . omega-3 acid ethyl esters  1 g Oral BID   Continuous Infusions: . sodium chloride 200 mL/hr at 04/02/18 0605     LOS: 1 day    Time spent: 35 minutes   Dessa Phi, DO [facility] [email or web address] Password [email or web address] 04/02/2018, 10:59 AM

## 2018-04-02 NOTE — Progress Notes (Signed)
CCMD notified this RN that pt. Had changes in T-wave pattern and has inverted on the monitor. VS obtained along with 12 lead EKG. BP 127/73 with HR of 111. On call NP Bodenheimer paged and made aware. Will continue to monitor pt. Closely and will carry out any new orders.

## 2018-04-03 LAB — COMPREHENSIVE METABOLIC PANEL
ALT: 33 U/L (ref 0–44)
ALT: 27 U/L (ref 0–44)
ANION GAP: 14 (ref 5–15)
ANION GAP: 11 (ref 5–15)
AST: 198 U/L — AB (ref 15–41)
AST: 36 U/L (ref 15–41)
Albumin: 3 g/dL — ABNORMAL LOW (ref 3.5–5.0)
Albumin: 2.8 g/dL — ABNORMAL LOW (ref 3.5–5.0)
Alkaline Phosphatase: 86 U/L (ref 38–126)
Alkaline Phosphatase: 88 U/L (ref 38–126)
BILIRUBIN TOTAL: 1.1 mg/dL (ref 0.3–1.2)
BUN: 3 mg/dL — AB (ref 6–20)
BUN: 5 mg/dL — ABNORMAL LOW (ref 6–20)
CALCIUM: 8.9 mg/dL (ref 8.9–10.3)
CHLORIDE: 100 mmol/L (ref 98–111)
CHLORIDE: 100 mmol/L (ref 98–111)
CO2: 19 mmol/L — ABNORMAL LOW (ref 22–32)
CO2: 22 mmol/L (ref 22–32)
CREATININE: 0.65 mg/dL (ref 0.61–1.24)
Calcium: 8.9 mg/dL (ref 8.9–10.3)
Creatinine, Ser: 0.72 mg/dL (ref 0.61–1.24)
GFR calc Af Amer: >60 mL/min (ref >60)
Glucose, Bld: 205 mg/dL — ABNORMAL HIGH (ref 70–99)
Glucose, Bld: 185 mg/dL — ABNORMAL HIGH (ref 70–99)
POTASSIUM: 5.1 mmol/L (ref 3.5–5.1)
Potassium: 3.9 mmol/L (ref 3.5–5.1)
SODIUM: 133 mmol/L — AB (ref 135–145)
Sodium: 133 mmol/L — ABNORMAL LOW (ref 135–145)
TOTAL PROTEIN: 6.3 g/dL — AB (ref 6.5–8.1)
TOTAL PROTEIN: 6.3 g/dL — AB (ref 6.5–8.1)
Total Bilirubin: 2.4 mg/dL — ABNORMAL HIGH (ref 0.3–1.2)

## 2018-04-03 LAB — GLUCOSE, CAPILLARY
GLUCOSE-CAPILLARY: 204 mg/dL — AB (ref 70–99)
GLUCOSE-CAPILLARY: 191 mg/dL — AB (ref 70–99)
Glucose-Capillary: 161 mg/dL — ABNORMAL HIGH (ref 70–99)
Glucose-Capillary: 181 mg/dL — ABNORMAL HIGH (ref 70–99)
Glucose-Capillary: 183 mg/dL — ABNORMAL HIGH (ref 70–99)
Glucose-Capillary: 209 mg/dL — ABNORMAL HIGH (ref 70–99)

## 2018-04-03 LAB — URINE CULTURE: Culture: NO GROWTH

## 2018-04-03 LAB — CBC
HEMATOCRIT: 38.8 % — AB (ref 39.0–52.0)
Hemoglobin: 13.4 g/dL (ref 13.0–17.0)
MCH: 29.5 pg (ref 26.0–34.0)
MCHC: 34.5 g/dL (ref 30.0–36.0)
MCV: 85.5 fL (ref 78.0–100.0)
PLATELETS: 291 10*3/uL (ref 150–400)
RBC: 4.54 MIL/uL (ref 4.22–5.81)
RDW: 13.5 % (ref 11.5–15.5)
WBC: 13.2 10*3/uL — ABNORMAL HIGH (ref 4.0–10.5)

## 2018-04-03 MED ORDER — IBUPROFEN 200 MG PO TABS
600.0000 mg | ORAL_TABLET | Freq: Four times a day (QID) | ORAL | Status: DC | PRN
  Administered 2018-04-03 – 2018-04-09 (×10): 600 mg via ORAL
  Filled 2018-04-03 (×10): qty 3

## 2018-04-03 MED ORDER — SODIUM CHLORIDE 0.9 % IV SOLN: INTRAVENOUS | Status: DC

## 2018-04-03 MED ORDER — SODIUM CHLORIDE 0.9 % IV SOLN
INTRAVENOUS | Status: DC
  Administered 2018-04-03 – 2018-04-09 (×11): via INTRAVENOUS

## 2018-04-03 MED ORDER — INSULIN GLARGINE 100 UNIT/ML ~~LOC~~ SOLN
25.0000 [IU] | Freq: Every day | SUBCUTANEOUS | Status: DC
  Administered 2018-04-03 – 2018-04-09 (×7): 25 [IU] via SUBCUTANEOUS
  Filled 2018-04-03 (×7): qty 0.25

## 2018-04-03 NOTE — Plan of Care (Signed)
Patient stable during 7 a to 7 p shift, medicated for pain x 1.  Patient with T max of 101.1 this shift, Motrin given with improvement.  Tolerating full liquids this shift, no nausea or vomiting.  Staff continues to provide education regarding patients newly diagnosed diabetes.  Patient did administer 2 injections in his abdomen this shift.  Mother and other family members in room most of shift.

## 2018-04-03 NOTE — Plan of Care (Signed)
Nutrition Education Note  RD consulted for nutrition education regarding new-onset diabetes.  Spoke with pt, pt's parents, and pt's grandmother at bedside.  Pt reports that he typically eats 3 meals daily: Breakfast: fried egg OR cereal with milk Lunch: sandwich and fries from [employer] or [employer] or [employer] Dinner: grilled meat (pork chop, burger) and vegetables  Pt reports typically drinking tea (pt sweetens it himself), [employer], water, and soda.  Lab Results  Component Value Date   HGBA1C 13.9 (H) 04/01/2018    RD provided "Carbohydrate Counting for People with Diabetes" handout from the Academy of Nutrition and Dietetics. Discussed different food groups and their effects on blood sugar, emphasizing carbohydrate-containing foods. Per pt request, discussed normal pathophysiology for someone without diabetes and the relationship between blood glucose levels and insulin. Provided list of carbohydrates and recommended serving sizes of common foods. Encouraged pt to put the list with serving sizes on his refrigerator and to take a photo of it with his phone so that he will always have it available.  Discussed importance of controlled and consistent carbohydrate intake throughout the day. Provided examples of ways to balance meals/snacks and encouraged intake of high-fiber, whole grain complex carbohydrates. Teach back method used.  Pt and family with several questions regarding how many times a day pt should check his blood sugar, how he will check his blood sugar, and how he will get insulin. RD encouraged pt and family to discuss these questions in more detail with Diabetes Coordinator.  Expect good compliance.  Body mass index is 21.42 kg/m. Pt meets criteria for "Normal Weight" based on current BMI.  Current diet order is Full Liquid. No meal completion charted at this time. Labs and medications reviewed. No further nutrition interventions warranted at this time. RD contact  information provided. If additional nutrition issues arise, please re-consult RD.   Earma ReadingKate Jablonski Sesilia Poucher, MS, RD, LDN Pager: [phone] Weekend/After Hours: [phone]

## 2018-04-03 NOTE — Progress Notes (Signed)
PROGRESS NOTE    Alfred Li  ZOX:[id number] DOB: 1995/10/07 DOA: 04/01/2018 PCP: Patient, No Pcp Per     Brief Narrative:  Alfred Li is a 22 year old male with no previous medical history who presents with 4 days history of epigastric pain.  He admits to progressive pain that worsened yesterday.  One episode of vomiting.  He also admits to polydipsia and polyuria over the past few months.  In the emergency department, CT abdomen pelvis revealed findings consistent with acute pancreatitis.  Triglycerides were over 5000, hemoglobin A1c 13.9.  New events last 24 hours / Subjective: Abdominal pain better. No nausea, vomiting   Assessment & Plan:   Principal Problem:   Pancreatitis Active Problems:   SIRS (systemic inflammatory response syndrome) (HCC)   High triglycerides   Diabetes (HCC)   LFT elevation  SIRS secondary to acute pancreatitis, pancreatitis secondary to elevated triglycerides -Continue supportive care, IV fluid, pain control -CT A/P without gallstones. Denies alcohol use.  -Gemfibrozil, Lipitor, omega-3 ordered for triglyceridemia >5000 -Advance diet today to full liquids  -Will check blood cultures today due to continued fever; suspect this is secondary to pancreatitis, but his symptoms are improving overall   New diagnosis diabetes, uncontrolled with hyperglycemia -Hemoglobin A1c 13.9 -Diabetic coordinator consulted -Discussed with mom and patient that patient will need insulin on discharge home -Likely type 1 diabetes in this young 22 yo patient with BMI 21. Check C-peptide, glutamic acid antibody, Proinsulin - pending  -Sliding scale insulin. Add lantus - increase dose today   Elevated LFT -Likely due to pancreatitis -Hepatitis panel pending  -Repeat LFT pending   Doubt UTI -UA negative for leukocytes, nitrites, rare bacteria. Urine culture negative. Stop Rocephin  Abnormal EKG -EKGs reviewed independently. Sinus tachycardia with peaked T  waves anterior leads and questionable T wave elevation lateral leads. Repeat EKG this morning with sinus tachycardia, J-point elevation without significant ST elevation.  -No chest pain. Monitor on telemetry    DVT prophylaxis: Loveox Code Status: Full Family Communication: Mother at bedside Disposition Plan: Pending improvement   Consultants:   None  Procedures:   None   Antimicrobials:  Anti-infectives (From admission, onward)   Start     Dose/Rate Route Frequency Ordered Stop   04/01/18 2315  cefTRIAXone (ROCEPHIN) 1 g in sodium chloride 0.9 % 100 mL IVPB  Status:  Discontinued     1 g 200 mL/hr over 30 Minutes Intravenous Every 24 hours 04/01/18 2312 04/02/18 0727       Objective: Vitals:   04/02/18 1510 04/02/18 1511 04/02/18 2012 04/03/18 0443  BP: 134/78  123/71 124/76  Pulse: (!) 128  (!) 121 (!) 133  Resp: 18  16 18   Temp: (!) 102.5 F (39.2 C) (!) 103.1 F (39.5 C) 99.5 F (37.5 C) (!) 102.2 F (39 C)  TempSrc: Oral Oral  Oral  SpO2: 97%  97% 100%  Weight:      Height:        Intake/Output Summary (Last 24 hours) at 04/03/2018 0929 Last data filed at 04/02/2018 2315 Gross per 24 hour  Intake 3450 ml  Output 600 ml  Net 2850 ml   Filed Weights   04/01/18 1750 04/02/18 0557  Weight: 66.3 kg (146 lb 2 oz) 67.7 kg (149 lb 4 oz)    Examination: General exam: Appears calm and comfortable  Respiratory system: Clear to auscultation. Respiratory effort normal. Cardiovascular system: S1 & S2 heard, tachycardic, regular. No JVD, murmurs, rubs, gallops or clicks.  No pedal edema. Gastrointestinal system: Abdomen is nondistended, soft and mildly TTP epigastric but no rebound or guarding. No organomegaly or masses felt. Normal bowel sounds heard. Central nervous system: Alert and oriented. No focal neurological deficits. Extremities: Symmetric 5 x 5 power. Skin: No rashes, lesions or ulcers Psychiatry: Judgement and insight appear normal. Mood & affect flat.      Data Reviewed: I have personally reviewed following labs and imaging studies  CBC: Recent Labs  Lab 04/01/18 1803 04/02/18 0124 04/03/18 0527  WBC 15.7* 14.6* 13.2*  HGB 15.7 15.2 13.4  HCT 43.2 42.8 38.8*  MCV 83.9 82.9 85.5  PLT 279 269 291   Basic Metabolic Panel: Recent Labs  Lab 04/01/18 1803 04/02/18 0124  NA 137 136  K 4.0 4.1  CL 96* 98  CO2 25 24  GLUCOSE 339* 298*  BUN 6 <5*  CREATININE 0.78 0.71  CALCIUM 10.5* 9.7   GFR: Estimated Creatinine Clearance: 142.2 mL/min (by C-G formula based on SCr of 0.71 mg/dL). Liver Function Tests: Recent Labs  Lab 04/01/18 1803 04/02/18 0124  AST 31 22  ALT 78* 62*  ALKPHOS 145* 125  BILITOT 0.6 0.4  PROT 8.2* 7.6  ALBUMIN 4.5 4.1   Recent Labs  Lab 04/01/18 1803 04/02/18 0124  LIPASE 109* 86*   No results for input(s): AMMONIA in the last 168 hours. Coagulation Profile: No results for input(s): INR, PROTIME in the last 168 hours. Cardiac Enzymes: No results for input(s): CKTOTAL, CKMB, CKMBINDEX, TROPONINI in the last 168 hours. BNP (last 3 results) No results for input(s): PROBNP in the last 8760 hours. HbA1C: Recent Labs    04/01/18 2047  HGBA1C 13.9*   CBG: Recent Labs  Lab 04/02/18 1724 04/02/18 2014 04/03/18 0011 04/03/18 0447 04/03/18 0759  GLUCAP 220* 186* 183* 204* 191*   Lipid Profile: Recent Labs    04/02/18 0124  CHOL 935*  HDL NOT REPORTED DUE TO HIGH TRIGLYCERIDES  LDLCALC UNABLE TO CALCULATE IF TRIGLYCERIDE OVER 400 mg/dL  TRIG >1,610*  CHOLHDL NOT REPORTED DUE TO HIGH TRIGLYCERIDES   Thyroid Function Tests: No results for input(s): TSH, T4TOTAL, FREET4, T3FREE, THYROIDAB in the last 72 hours. Anemia Panel: No results for input(s): VITAMINB12, FOLATE, FERRITIN, TIBC, IRON, RETICCTPCT in the last 72 hours. Sepsis Labs: Recent Labs  Lab 04/01/18 2113  LATICACIDVEN 2.64*    Recent Results (from the past 240 hour(s))  Culture, Urine     Status: None   Collection  Time: 04/02/18  6:15 AM  Result Value Ref Range Status   Specimen Description   Final    URINE, SUPRAPUBIC Performed at [facility], [address] [place], [place] [address]    Special Requests   Final    NONE Performed at [facility], [address], [place], [place] [address]    Culture   Final    NO GROWTH Performed at [facility], [address] [place], [place] [address]    Report Status 04/03/2018 FINAL  Final       Radiology Studies: Ct Abdomen Pelvis W Contrast  Result Date: 04/01/2018 CLINICAL DATA:  Initial evaluation for acute right upper quadrant pain, fever. EXAM: CT ABDOMEN AND PELVIS WITH CONTRAST TECHNIQUE: Multidetector CT imaging of the abdomen and pelvis was performed using the standard protocol following bolus administration of intravenous contrast. CONTRAST:  ISOVUE-300 IOPAMIDOL (ISOVUE-300) INJECTION 61% COMPARISON:  None. FINDINGS: Lower chest: Visualized lungs are clear. Hepatobiliary: Liver demonstrates a normal  contrast enhanced appearance. Gallbladder within normal limits. No biliary dilatation. Pancreas: Hazy inflammatory stranding about the head and uncinate process of the pancreas, suggesting acute pancreatitis. No loculated peripancreatic collection or other complication identified. Spleen: Unremarkable. Adrenals/Urinary Tract: Adrenal glands are normal. Kidneys equal in size with symmetric enhancement. No nephrolithiasis, hydronephrosis, or focal enhancing renal mass. No hydroureter. Bladder within normal limits. Stomach/Bowel: Stomach within normal limits. Inflammatory stranding surrounds the duodenum due to the adjacent inflammatory process within the pancreas. No evidence for bowel obstruction. Normal appendix. No other acute inflammatory changes seen about the bowels. Vascular/Lymphatic: Normal intravascular enhancement seen throughout the intra-abdominal aorta and its branch vessels. No adenopathy.  Reproductive: Prostate normal. Other: No free air. Small volume free fluid within the upper abdomen related to the acute inflammatory changes about the pancreas. Musculoskeletal: No acute osseus abnormality. No worrisome lytic or blastic osseous lesions. IMPRESSION: 1. Findings consistent with acute pancreatitis. No complication identified. 2. No other acute abnormality within the abdomen and pelvis. Electronically Signed   By: Rise MuBenjamin  McClintock M.D.   On: 04/01/2018 22:07      Scheduled Meds: . atorvastatin  40 mg Oral q1800  . enoxaparin (LOVENOX) injection  40 mg Subcutaneous Q24H  . gemfibrozil  600 mg Oral BID AC  . insulin aspart  0-9 Units Subcutaneous Q4H  . insulin glargine  25 Units Subcutaneous Daily  . omega-3 acid ethyl esters  1 g Oral BID   Continuous Infusions: . sodium chloride 150 mL/hr at 04/03/18 0751     LOS: 2 days    Time spent: 25 minutes   Noralee StainJennifer Zohal Reny, DO [facility] [email or web address] Password [email or web address] 04/03/2018, 9:29 AM

## 2018-04-03 NOTE — Care Management Note (Signed)
Case Management Note  Patient Details  Name: Lilian ComaDmonte M Jarosz MRN: [id number] Date of Birth: Mar 06, 1996  Subjective/Objective:     Pancreatitis, DM               Action/Plan: NCM spoke to pt's mother, states [facility]. States she will arrange follow up appt for pt on Monday. His [employer] is with his father's insurance. Pt starts new job on 7/29 and goes back to college in Aug. Explained attending can write a note if needed for his job.   Expected Discharge Date:                Expected Discharge Plan:  Home/Self Care  In-House Referral:  NA  Discharge planning Services  CM Consult  Post Acute Care Choice:  NA Choice offered to:  NA  DME Arranged:  N/A DME Agency:  NA  HH Arranged:  NA HH Agency:  NA  Status of Service:  Completed, signed off  If discussed at Long Length of Stay Meetings, dates discussed:    Additional Comments:  Elliot CousinShavis, Austen Oyster Ellen, RN 04/03/2018, 12:44 PM

## 2018-04-04 LAB — COMPREHENSIVE METABOLIC PANEL
ALBUMIN: 2.5 g/dL — AB (ref 3.5–5.0)
ALT: 26 U/L (ref 0–44)
AST: 25 U/L (ref 15–41)
Alkaline Phosphatase: 94 U/L (ref 38–126)
Anion gap: 10 (ref 5–15)
BUN: 5 mg/dL — AB (ref 6–20)
CHLORIDE: 105 mmol/L (ref 98–111)
CO2: 23 mmol/L (ref 22–32)
CREATININE: 0.64 mg/dL (ref 0.61–1.24)
Calcium: 8.5 mg/dL — ABNORMAL LOW (ref 8.9–10.3)
GFR calc Af Amer: >60 mL/min (ref >60)
GFR calc non Af Amer: >60 mL/min (ref >60)
GLUCOSE: 127 mg/dL — AB (ref 70–99)
POTASSIUM: 3.9 mmol/L (ref 3.5–5.1)
Sodium: 138 mmol/L (ref 135–145)
Total Bilirubin: 1.2 mg/dL (ref 0.3–1.2)
Total Protein: 5.8 g/dL — ABNORMAL LOW (ref 6.5–8.1)

## 2018-04-04 LAB — HEPATITIS PANEL, ACUTE
HEP A IGM: NEGATIVE
HEP B C IGM: NEGATIVE
HEP B S AG: NEGATIVE

## 2018-04-04 LAB — GLUCOSE, CAPILLARY
Glucose-Capillary: 116 mg/dL — ABNORMAL HIGH (ref 70–99)
Glucose-Capillary: 116 mg/dL — ABNORMAL HIGH (ref 70–99)
Glucose-Capillary: 143 mg/dL — ABNORMAL HIGH (ref 70–99)
Glucose-Capillary: 164 mg/dL — ABNORMAL HIGH (ref 70–99)
Glucose-Capillary: 217 mg/dL — ABNORMAL HIGH (ref 70–99)
Glucose-Capillary: 91 mg/dL (ref 70–99)

## 2018-04-04 LAB — CBC
HCT: 33.4 % — ABNORMAL LOW (ref 39.0–52.0)
Hemoglobin: 11.4 g/dL — ABNORMAL LOW (ref 13.0–17.0)
MCH: 29.1 pg (ref 26.0–34.0)
MCHC: 34.1 g/dL (ref 30.0–36.0)
MCV: 85.2 fL (ref 78.0–100.0)
PLATELETS: 215 10*3/uL (ref 150–400)
RBC: 3.92 MIL/uL — AB (ref 4.22–5.81)
RDW: 13.7 % (ref 11.5–15.5)
WBC: 9.1 10*3/uL (ref 4.0–10.5)

## 2018-04-04 LAB — LIPASE, BLOOD: Lipase: 42 U/L (ref 11–51)

## 2018-04-04 MED ORDER — INSULIN STARTER KIT- PEN NEEDLES (ENGLISH)
1.0000 | Freq: Once | Status: AC
  Administered 2018-04-04: 1
  Filled 2018-04-04: qty 1

## 2018-04-04 NOTE — Progress Notes (Addendum)
Inpatient Diabetes Program Recommendations  AACE/ADA: New Consensus Statement on Inpatient Glycemic Control (2015)  Target Ranges:  Prepandial:   less than 140 mg/dL      Peak postprandial:   less than 180 mg/dL (1-2 hours)      Critically ill patients:  140 - 180 mg/dL   Results for Alfred Li, Alfred Li (MRN [id number]) as of 04/04/2018 14:09  Ref. Range 04/04/2018 04:57 04/04/2018 07:46 04/04/2018 11:36  Glucose-Capillary Latest Ref Range: 70 - 99 mg/dL 116 (H) 116 (H) 91    Diabetes history: New diagnosis  Inpatient Diabetes Program Recommendations:    Fasting glucose 116 this am. Would keep patient on 25 units of Lantus for now.  Will give patient and mom information on Lantus solostar pen copay card $0 copay, and Novolog flexpen copay card $25 for 2 years tomorrow when mom is present  Waiting on C-peptide and antibody tests to result, may take a couple of days.  May want to run benefits check on Novolog vs Humalog to see if copay card is cheaper than the prescription through insurance.  Educated patient on insulin pen use at home. Reviewed contents of insulin flexpen starter kit. Reviewed all steps if insulin pen including attachment of needle, 2-unit air shot, dialing up dose, giving injection, removing needle, disposal of sharps, storage of unused insulin, disposal of insulin etc. Also reviewed troubleshooting with insulin pen.  Patient seems very young and immature in nature. I prefer to have an adult present for education as well in addition formulating plans for when patient is d/c'd and plans for when he goes to school in the fall.   Spoke at length with patient's grandmother at bedside about food and insulin requirements for now. Informed her of the advancement in diet. Patient reports having the full liquids just run out of him.  Discussed with patient his lunch tray and calculating the Carbs. Patient needs more practice. Patient is not eating his food. Only had half a role with  lunch. Says ever since he drank apple juice earlier and his stomach hurts.  Up-to-date article sent to Dr. Maylene Roes regarding IV insulin treatment for severely elevated triglyceride levels. Per article IV insulin started at a 0.1-0.3 units/kg/hour CBG checks Q1 hour Triglyceride levels Q12 hours And D5 added if glucose is 150-200 mg/dl to prevent hypo May want to do Endocrinology referral and contact Endocrinologist Dr. Delrae Rend [phone]) in regards to IV insulin management and recommendations to follow patient in addition to potential follow up after d/c.  Thanks,  Tama Headings RN, MSN, BC-ADM, [facility] Inpatient Diabetes Coordinator Team Pager [phone] (8a-5p)

## 2018-04-04 NOTE — Progress Notes (Addendum)
PROGRESS NOTE    Alfred Li  ZOX:[id number] DOB: [birth year] DOA: 04/01/2018 PCP: Patient, No Pcp Per     Brief Narrative:  Alfred Li is a 22 year old male with no previous medical history who presents with 4 days history of epigastric pain.  He admits to progressive pain that worsened yesterday.  One episode of vomiting.  He also admits to polydipsia and polyuria over the past few months.  In the emergency department, CT abdomen pelvis revealed findings consistent with acute pancreatitis.  Triglycerides were over 5000, hemoglobin A1c 13.9.  New events last 24 hours / Subjective: No new complaints, has abdominal pain in a band-like distribution across his upper abdomen that is dull in nature. Tolerated full liquid diet without issue.   Assessment & Plan:   Principal Problem:   Pancreatitis Active Problems:   SIRS (systemic inflammatory response syndrome) (HCC)   High triglycerides   Diabetes (HCC)   LFT elevation  SIRS secondary to acute pancreatitis, pancreatitis secondary to elevated triglycerides -Continue supportive care, IV fluid, pain control  -CT A/P without gallstones. Denies alcohol use.  -Gemfibrozil, Lipitor, omega-3 ordered for triglyceridemia >5000 -Advance diet today to carb modified  -Blood cultures ordered due to continued fever; suspect this is secondary to pancreatitis, but his symptoms are improving overall. WBC normal. If continues to have fever, may eval with repeat imaging   New diagnosis diabetes, uncontrolled with hyperglycemia -Hemoglobin A1c 13.9 -Diabetic coordinator consulted -Discussed with mom and patient that patient will need insulin on discharge home -Likely type 1 diabetes in this young 22 yo patient with BMI 21. Check C-peptide, glutamic acid antibody, Proinsulin - pending  -Lantus, sliding scale insulin -Blood sugar well controlled today   Elevated LFT -Likely due to pancreatitis -Hepatitis panel pending  -Repeat LFT improved    Doubt UTI -UA negative for leukocytes, nitrites, rare bacteria. Urine culture negative. Stop Rocephin  Abnormal EKG -EKGs reviewed independently. Sinus tachycardia with peaked T waves anterior leads and questionable T wave elevation lateral leads. Repeat EKG this morning with sinus tachycardia, J-point elevation without significant ST elevation.  -No chest pain. Monitor on telemetry    DVT prophylaxis: Loveox Code Status: Full Family Communication: No family at bedside, spoke w mom over the phone  Disposition Plan: Pending improvement in fever    Consultants:   None  Procedures:   None   Antimicrobials:  Anti-infectives (From admission, onward)   Start     Dose/Rate Route Frequency Ordered Stop   04/01/18 2315  cefTRIAXone (ROCEPHIN) 1 g in sodium chloride 0.9 % 100 mL IVPB  Status:  Discontinued     1 g 200 mL/hr over 30 Minutes Intravenous Every 24 hours 04/01/18 2312 04/02/18 0727       Objective: Vitals:   04/04/18 0011 04/04/18 0217 04/04/18 0500 04/04/18 0600  BP: 124/70  96/62   Pulse: (!) 143 (!) 108 96   Resp:   16   Temp: (!) 102.1 F (38.9 C) 99.8 F (37.7 C) 98.7 F (37.1 C)   TempSrc: Oral Oral Oral   SpO2: 100% 100% 100%   Weight:    67.6 kg (149 lb)  Height:        Intake/Output Summary (Last 24 hours) at 04/04/2018 1133 Last data filed at 04/03/2018 2300 Gross per 24 hour  Intake 1850 ml  Output -  Net 1850 ml   Filed Weights   04/01/18 1750 04/02/18 0557 04/04/18 0600  Weight: 66.3 kg (146 lb 2 oz) 67.7  kg (149 lb 4 oz) 67.6 kg (149 lb)    Examination: General exam: Appears calm and comfortable  Respiratory system: Clear to auscultation. Respiratory effort normal. Cardiovascular system: S1 & S2 heard, tachycardic regular rhythm. No JVD, murmurs, rubs, gallops or clicks. No pedal edema. Gastrointestinal system: Abdomen is nondistended, soft and TTP epigastric without guarding. No organomegaly or masses felt. Normal bowel sounds  heard. Central nervous system: Alert and oriented. No focal neurological deficits. Extremities: Symmetric 5 x 5 power. Skin: No rashes, lesions or ulcers Psychiatry: Mood & affect flat.    Data Reviewed: I have personally reviewed following labs and imaging studies  CBC: Recent Labs  Lab 04/01/18 1803 04/02/18 0124 04/03/18 0527 04/04/18 0455  WBC 15.7* 14.6* 13.2* 9.1  HGB 15.7 15.2 13.4 11.4*  HCT 43.2 42.8 38.8* 33.4*  MCV 83.9 82.9 85.5 85.2  PLT 279 269 291 215   Basic Metabolic Panel: Recent Labs  Lab 04/01/18 1803 04/02/18 0124 04/03/18 0527 04/03/18 1512 04/04/18 0455  NA 137 136 133* 133* 138  K 4.0 4.1 3.9 5.1 3.9  CL 96* 98 100 100 105  CO2 25 24 19* 22 23  GLUCOSE 339* 298* 205* 185* 127*  BUN 6 <5* 3* 5* 5*  CREATININE 0.78 0.71 0.72 0.65 0.64  CALCIUM 10.5* 9.7 8.9 8.9 8.5*   GFR: Estimated Creatinine Clearance: 142 mL/min (by C-G formula based on SCr of 0.64 mg/dL). Liver Function Tests: Recent Labs  Lab 04/01/18 1803 04/02/18 0124 04/03/18 0527 04/03/18 1512 04/04/18 0455  AST 31 22 198* 36 25  ALT 78* 62* 33 27 26  ALKPHOS 145* 125 86 88 94  BILITOT 0.6 0.4 1.1 2.4* 1.2  PROT 8.2* 7.6 6.3* 6.3* 5.8*  ALBUMIN 4.5 4.1 3.0* 2.8* 2.5*   Recent Labs  Lab 04/01/18 1803 04/02/18 0124  LIPASE 109* 86*   No results for input(s): AMMONIA in the last 168 hours. Coagulation Profile: No results for input(s): INR, PROTIME in the last 168 hours. Cardiac Enzymes: No results for input(s): CKTOTAL, CKMB, CKMBINDEX, TROPONINI in the last 168 hours. BNP (last 3 results) No results for input(s): PROBNP in the last 8760 hours. HbA1C: Recent Labs    04/01/18 2047  HGBA1C 13.9*   CBG: Recent Labs  Lab 04/03/18 1705 04/03/18 2007 04/04/18 0023 04/04/18 0457 04/04/18 0746  GLUCAP 161* 181* 143* 116* 116*   Lipid Profile: Recent Labs    04/02/18 0124  CHOL 935*  HDL NOT REPORTED DUE TO HIGH TRIGLYCERIDES  LDLCALC UNABLE TO CALCULATE IF  TRIGLYCERIDE OVER 400 mg/dL  TRIG >0,981*  CHOLHDL NOT REPORTED DUE TO HIGH TRIGLYCERIDES   Thyroid Function Tests: No results for input(s): TSH, T4TOTAL, FREET4, T3FREE, THYROIDAB in the last 72 hours. Anemia Panel: No results for input(s): VITAMINB12, FOLATE, FERRITIN, TIBC, IRON, RETICCTPCT in the last 72 hours. Sepsis Labs: Recent Labs  Lab 04/01/18 2113  LATICACIDVEN 2.64*    Recent Results (from the past 240 hour(s))  Culture, Urine     Status: None   Collection Time: 04/02/18  6:15 AM  Result Value Ref Range Status   Specimen Description   Final    URINE, SUPRAPUBIC Performed at [facility], [address] [place], [place] [address]    Special Requests   Final    NONE Performed at [facility], [address], [place], [place] [address]    Culture   Final    NO GROWTH Performed at [facility], [address] [place], [place] [address]    Report Status 04/03/2018 FINAL  Final       Radiology Studies: No results found.    Scheduled Meds: . atorvastatin  40 mg Oral q1800  . enoxaparin (LOVENOX) injection  40 mg Subcutaneous Q24H  . gemfibrozil  600 mg Oral BID AC  . insulin aspart  0-9 Units Subcutaneous Q4H  . insulin glargine  25 Units Subcutaneous Daily  . omega-3 acid ethyl esters  1 g Oral BID   Continuous Infusions: . sodium chloride 150 mL/hr at 04/04/18 0655     LOS: 3 days    Time spent: 25 minutes   Noralee Stain, DO [facility] [email or web address] Password [facility] 04/04/2018, 11:33 AM

## 2018-04-04 NOTE — Progress Notes (Signed)
MD given update via phone regarding Pt's temperature 102.5 and Heart Rate 135-140s. BP 125/78. PO Tylenol given and for Pt's complaint of back pain PO Ibuprofen given. Maintain current Plan of care.

## 2018-04-05 ENCOUNTER — Inpatient Hospital Stay (HOSPITAL_COMMUNITY): Payer: 59

## 2018-04-05 ENCOUNTER — Encounter (HOSPITAL_COMMUNITY): Payer: Self-pay | Admitting: Radiology

## 2018-04-05 LAB — COMPREHENSIVE METABOLIC PANEL
ALBUMIN: 2.6 g/dL — AB (ref 3.5–5.0)
ALK PHOS: 175 U/L — AB (ref 38–126)
ALT: 43 U/L (ref 0–44)
ANION GAP: 8 (ref 5–15)
AST: 52 U/L — AB (ref 15–41)
BILIRUBIN TOTAL: 1.5 mg/dL — AB (ref 0.3–1.2)
BUN: 5 mg/dL — AB (ref 6–20)
CO2: 24 mmol/L (ref 22–32)
Calcium: 8.7 mg/dL — ABNORMAL LOW (ref 8.9–10.3)
Chloride: 105 mmol/L (ref 98–111)
Creatinine, Ser: 0.52 mg/dL — ABNORMAL LOW (ref 0.61–1.24)
GFR calc Af Amer: >60 mL/min (ref >60)
GFR calc non Af Amer: >60 mL/min (ref >60)
GLUCOSE: 166 mg/dL — AB (ref 70–99)
Potassium: 4 mmol/L (ref 3.5–5.1)
Sodium: 137 mmol/L (ref 135–145)
Total Protein: 6.4 g/dL — ABNORMAL LOW (ref 6.5–8.1)

## 2018-04-05 LAB — C-PEPTIDE

## 2018-04-05 LAB — CBC
HEMATOCRIT: 33.3 % — AB (ref 39.0–52.0)
Hemoglobin: 11.1 g/dL — ABNORMAL LOW (ref 13.0–17.0)
MCH: 28.6 pg (ref 26.0–34.0)
MCHC: 33.3 g/dL (ref 30.0–36.0)
MCV: 85.8 fL (ref 78.0–100.0)
Platelets: 272 10*3/uL (ref 150–400)
RBC: 3.88 MIL/uL — AB (ref 4.22–5.81)
RDW: 14.1 % (ref 11.5–15.5)
WBC: 9 10*3/uL (ref 4.0–10.5)

## 2018-04-05 LAB — GLUTAMIC ACID DECARBOXYLASE AUTO ABS

## 2018-04-05 LAB — GLUCOSE, CAPILLARY
GLUCOSE-CAPILLARY: 132 mg/dL — AB (ref 70–99)
GLUCOSE-CAPILLARY: 146 mg/dL — AB (ref 70–99)
GLUCOSE-CAPILLARY: 147 mg/dL — AB (ref 70–99)
GLUCOSE-CAPILLARY: 152 mg/dL — AB (ref 70–99)
Glucose-Capillary: 175 mg/dL — ABNORMAL HIGH (ref 70–99)
Glucose-Capillary: 112 mg/dL — ABNORMAL HIGH (ref 70–99)

## 2018-04-05 LAB — TRIGLYCERIDES: TRIGLYCERIDES: 1208 mg/dL — AB (ref <150)

## 2018-04-05 MED ORDER — IOPAMIDOL (ISOVUE-300) INJECTION 61%
15.0000 mL | Freq: Once | INTRAVENOUS | Status: AC | PRN
  Administered 2018-04-05: 15 mL via ORAL
  Filled 2018-04-05: qty 30

## 2018-04-05 MED ORDER — IOPAMIDOL (ISOVUE-300) INJECTION 61%
100.0000 mL | Freq: Once | INTRAVENOUS | Status: AC | PRN
  Administered 2018-04-05: 75 mL via INTRAVENOUS

## 2018-04-05 MED ORDER — IOPAMIDOL (ISOVUE-300) INJECTION 61%
INTRAVENOUS | Status: AC
  Filled 2018-04-05: qty 30

## 2018-04-05 NOTE — Progress Notes (Addendum)
PROGRESS NOTE    Alfred Li  ZOX:[id number] DOB: 1996-08-27 DOA: 04/01/2018 PCP: Patient, No Pcp Per     Brief Narrative:  Alfred Li is a 22 year old male with no previous medical history who presents with 4 days history of epigastric pain.  He admits to progressive pain that worsened yesterday.  One episode of vomiting.  He also admits to polydipsia and polyuria over the past few months.  In the emergency department, CT abdomen pelvis revealed findings consistent with acute pancreatitis.  Triglycerides were over 5000, hemoglobin A1c 13.9.  New events last 24 hours / Subjective: Continues to have some epigastric tenderness. Tolerating diet.   Assessment & Plan:   Principal Problem:   Pancreatitis Active Problems:   SIRS (systemic inflammatory response syndrome) (HCC)   High triglycerides   Diabetes (HCC)   LFT elevation  SIRS secondary to acute pancreatitis, pancreatitis secondary to elevated triglycerides -Continue supportive care, IV fluid, pain control  -CT A/P without gallstones. Denies alcohol use.  -Gemfibrozil, Lipitor, omega-3 ordered for triglyceridemia >5000 -Blood cultures ordered due to continued fever; suspect this is secondary to pancreatitis. WBC normal. TG improving, lipase normal. But due to continued epigastric pain and fever, repeat CT abdomen is ordered today  New diagnosis diabetes, uncontrolled with hyperglycemia -Hemoglobin A1c 13.9 -Diabetic coordinator consulted -Discussed with mom and patient that patient will need insulin on discharge home -Likely type 1 diabetes in this young 22 yo patient with BMI 21. Check C-peptide, glutamic acid antibody, Proinsulin - pending  -Lantus, sliding scale insulin -Blood sugar well controlled today   Elevated LFT -Likely due to pancreatitis -Hepatitis panel negative   Doubt UTI -UA negative for leukocytes, nitrites, rare bacteria. Urine culture negative. Stop Rocephin  Abnormal EKG -EKGs reviewed  independently. Sinus tachycardia with peaked T waves anterior leads and questionable T wave elevation lateral leads. Repeat EKG this morning with sinus tachycardia, J-point elevation without significant ST elevation.  -No chest pain. Monitor on telemetry    DVT prophylaxis: Loveox Code Status: Full Family Communication: No family at bedside, spoke with mom over the phone  Disposition Plan: Pending improvement in fever    Consultants:   None  Procedures:   None   Antimicrobials:  Anti-infectives (From admission, onward)   Start     Dose/Rate Route Frequency Ordered Stop   04/01/18 2315  cefTRIAXone (ROCEPHIN) 1 g in sodium chloride 0.9 % 100 mL IVPB  Status:  Discontinued     1 g 200 mL/hr over 30 Minutes Intravenous Every 24 hours 04/01/18 2312 04/02/18 0727       Objective: Vitals:   04/04/18 1844 04/04/18 2228 04/05/18 0456 04/05/18 0500  BP:  122/82 110/71   Pulse: (!) 113 (!) 107 (!) 122   Resp:  17 16   Temp:  98.2 F (36.8 C) (!) 100.6 F (38.1 C)   TempSrc:  Oral Oral   SpO2:  100% 95%   Weight:    69.7 kg (153 lb 10.6 oz)  Height:        Intake/Output Summary (Last 24 hours) at 04/05/2018 1156 Last data filed at 04/05/2018 0600 Gross per 24 hour  Intake 2743.33 ml  Output 0 ml  Net 2743.33 ml   Filed Weights   04/02/18 0557 04/04/18 0600 04/05/18 0500  Weight: 67.7 kg (149 lb 4 oz) 67.6 kg (149 lb) 69.7 kg (153 lb 10.6 oz)    Examination: General exam: Appears calm and comfortable  Respiratory system: Clear to auscultation. Respiratory  effort normal. Cardiovascular system: S1 & S2 heard, RRR. No JVD, murmurs, rubs, gallops or clicks. No pedal edema. Gastrointestinal system: Abdomen is nondistended, soft and TTP epigastric region. No organomegaly or masses felt. Normal bowel sounds heard. Central nervous system: Alert and oriented. No focal neurological deficits. Extremities: Symmetric 5 x 5 power. Skin: No rashes, lesions or ulcers Psychiatry:  Judgement and insight appear poor. Mood & affect flat.     Data Reviewed: I have personally reviewed following labs and imaging studies  CBC: Recent Labs  Lab 04/01/18 1803 04/02/18 0124 04/03/18 0527 04/04/18 0455 04/05/18 0503  WBC 15.7* 14.6* 13.2* 9.1 9.0  HGB 15.7 15.2 13.4 11.4* 11.1*  HCT 43.2 42.8 38.8* 33.4* 33.3*  MCV 83.9 82.9 85.5 85.2 85.8  PLT 279 269 291 215 272   Basic Metabolic Panel: Recent Labs  Lab 04/02/18 0124 04/03/18 0527 04/03/18 1512 04/04/18 0455 04/05/18 0503  NA 136 133* 133* 138 137  K 4.1 3.9 5.1 3.9 4.0  CL 98 100 100 105 105  CO2 24 19* 22 23 24   GLUCOSE 298* 205* 185* 127* 166*  BUN <5* 3* 5* 5* 5*  CREATININE 0.71 0.72 0.65 0.64 0.52*  CALCIUM 9.7 8.9 8.9 8.5* 8.7*   GFR: Estimated Creatinine Clearance: 146.4 mL/min (A) (by C-G formula based on SCr of 0.52 mg/dL (L)). Liver Function Tests: Recent Labs  Lab 04/02/18 0124 04/03/18 0527 04/03/18 1512 04/04/18 0455 04/05/18 0503  AST 22 198* 36 25 52*  ALT 62* 33 27 26 43  ALKPHOS 125 86 88 94 175*  BILITOT 0.4 1.1 2.4* 1.2 1.5*  PROT 7.6 6.3* 6.3* 5.8* 6.4*  ALBUMIN 4.1 3.0* 2.8* 2.5* 2.6*   Recent Labs  Lab 04/01/18 1803 04/02/18 0124 04/04/18 1445  LIPASE 109* 86* 42   No results for input(s): AMMONIA in the last 168 hours. Coagulation Profile: No results for input(s): INR, PROTIME in the last 168 hours. Cardiac Enzymes: No results for input(s): CKTOTAL, CKMB, CKMBINDEX, TROPONINI in the last 168 hours. BNP (last 3 results) No results for input(s): PROBNP in the last 8760 hours. HbA1C: No results for input(s): HGBA1C in the last 72 hours. CBG: Recent Labs  Lab 04/04/18 1622 04/04/18 2008 04/05/18 0005 04/05/18 0449 04/05/18 0749  GLUCAP 217* 164* 132* 147* 152*   Lipid Profile: Recent Labs    04/05/18 0504  TRIG 1,208*   Thyroid Function Tests: No results for input(s): TSH, T4TOTAL, FREET4, T3FREE, THYROIDAB in the last 72 hours. Anemia  Panel: No results for input(s): VITAMINB12, FOLATE, FERRITIN, TIBC, IRON, RETICCTPCT in the last 72 hours. Sepsis Labs: Recent Labs  Lab 04/01/18 2113  LATICACIDVEN 2.64*    Recent Results (from the past 240 hour(s))  Culture, Urine     Status: None   Collection Time: 04/02/18  6:15 AM  Result Value Ref Range Status   Specimen Description   Final    URINE, SUPRAPUBIC Performed at [facility], [address] [place], [place] [address]    Special Requests   Final    NONE Performed at [facility], [address], [place], [place] [address]    Culture   Final    NO GROWTH Performed at [facility], [address], [place], [place] [address]    Report Status 04/03/2018 FINAL  Final  Culture, blood (routine x 2)     Status: None (Preliminary result)   Collection Time: 04/03/18  7:50 AM  Result Value Ref Range  Status   Specimen Description   Final    BLOOD RIGHT HAND Performed at [facility], [address], [place], [place] [address]    Special Requests   Final    BOTTLES DRAWN AEROBIC ONLY Blood Culture adequate volume Performed at [facility], [address] [place], [place] [address]    Culture   Final    NO GROWTH < 24 HOURS Performed at [facility], [address] [place], [place] [address]    Report Status PENDING  Incomplete  Culture, blood (routine x 2)     Status: None (Preliminary result)   Collection Time: 04/03/18  7:53 AM  Result Value Ref Range Status   Specimen Description   Final    BLOOD RIGHT ARM Performed at [facility], [address], [place], [place] [address]    Special Requests   Final    BOTTLES DRAWN AEROBIC AND ANAEROBIC Blood Culture adequate volume Performed at [facility], [address], [place], [place] [address]    Culture   Final    NO GROWTH < 24 HOURS Performed at [facility], [address] [place], [place] [address]    Report Status PENDING  Incomplete       Radiology Studies: No results found.    Scheduled Meds: . atorvastatin  40 mg Oral q1800  . enoxaparin (LOVENOX) injection  40 mg Subcutaneous Q24H  . gemfibrozil  600 mg Oral BID AC  . insulin aspart  0-9 Units Subcutaneous Q4H  . insulin glargine  25 Units Subcutaneous Daily  . iopamidol      . omega-3 acid ethyl esters  1 g Oral BID   Continuous Infusions: . sodium chloride 100 mL/hr at 04/05/18 0513     LOS: 4 days    Time spent: 25 minutes   Noralee Stain, DO [facility] [email or web address] Password [facility] 04/05/2018, 11:56 AM

## 2018-04-05 NOTE — Progress Notes (Signed)
Inpatient Diabetes Program Recommendations  AACE/ADA: New Consensus Statement on Inpatient Glycemic Control (2015)  Target Ranges:  Prepandial:   less than 140 mg/dL      Peak postprandial:   less than 180 mg/dL (1-2 hours)      Critically ill patients:  140 - 180 mg/dL   Lab Results  Component Value Date   GLUCAP 175 (H) 04/05/2018   HGBA1C 13.9 (H) 04/01/2018    Met with patient in room. Patient sitting at the window. Patient feels lonely while here as there is no one to talk to. Encouraged patient to walk in the hallway if he felt like, it. Patient does not have abdominal pain today. And feels/looks better. Discussed carb counting again. Patient says he cheats with his phone. I told him that was fine and to google search an app that he likes that will help him calculate carbs on a day to day basis. We discussed his triglycerides and that its a component of cholesterol, how high they were and that it may have caused hi pancreatitis. Patient seemed very young during discussion but was engaged and asked many questions. Spoke to patient about the need for medication for triglycerides and close monitoring outpatient.  Will have DM coordinator follow tomorrow. Patient is always by himself in room. Due to patients immaturity level would suggest teaching with a parent in the room.  Thanks,  Tama Headings RN, MSN, BC-ADM, [facility] Inpatient Diabetes Coordinator Team Pager [phone] (8a-5p)

## 2018-04-06 ENCOUNTER — Inpatient Hospital Stay (HOSPITAL_COMMUNITY): Payer: 59

## 2018-04-06 DIAGNOSIS — E1069 Type 1 diabetes mellitus with other specified complication: Secondary | ICD-10-CM

## 2018-04-06 DIAGNOSIS — E781 Pure hyperglyceridemia: Secondary | ICD-10-CM

## 2018-04-06 LAB — CBC
HCT: 29 % — ABNORMAL LOW (ref 39.0–52.0)
Hemoglobin: 9.9 g/dL — ABNORMAL LOW (ref 13.0–17.0)
MCH: 28.7 pg (ref 26.0–34.0)
MCHC: 34.1 g/dL (ref 30.0–36.0)
MCV: 84.1 fL (ref 78.0–100.0)
Platelets: 298 K/uL (ref 150–400)
RBC: 3.45 MIL/uL — ABNORMAL LOW (ref 4.22–5.81)
RDW: 14.1 % (ref 11.5–15.5)
WBC: 8.6 K/uL (ref 4.0–10.5)

## 2018-04-06 LAB — GLUCOSE, CAPILLARY
GLUCOSE-CAPILLARY: 170 mg/dL — AB (ref 70–99)
Glucose-Capillary: 105 mg/dL — ABNORMAL HIGH (ref 70–99)
Glucose-Capillary: 114 mg/dL — ABNORMAL HIGH (ref 70–99)
Glucose-Capillary: 148 mg/dL — ABNORMAL HIGH (ref 70–99)
Glucose-Capillary: 149 mg/dL — ABNORMAL HIGH (ref 70–99)
Glucose-Capillary: 164 mg/dL — ABNORMAL HIGH (ref 70–99)
Glucose-Capillary: 192 mg/dL — ABNORMAL HIGH (ref 70–99)
Glucose-Capillary: 204 mg/dL — ABNORMAL HIGH (ref 70–99)

## 2018-04-06 LAB — COMPREHENSIVE METABOLIC PANEL WITH GFR
ALT: 62 U/L — ABNORMAL HIGH (ref 0–44)
AST: 61 U/L — ABNORMAL HIGH (ref 15–41)
Albumin: 2.6 g/dL — ABNORMAL LOW (ref 3.5–5.0)
Alkaline Phosphatase: 213 U/L — ABNORMAL HIGH (ref 38–126)
Anion gap: 8 (ref 5–15)
BUN: 5 mg/dL — ABNORMAL LOW (ref 6–20)
CO2: 23 mmol/L (ref 22–32)
Calcium: 8.9 mg/dL (ref 8.9–10.3)
Chloride: 107 mmol/L (ref 98–111)
Creatinine, Ser: 0.5 mg/dL — ABNORMAL LOW (ref 0.61–1.24)
GFR calc Af Amer: >60 mL/min
GFR calc non Af Amer: >60 mL/min
Glucose, Bld: 116 mg/dL — ABNORMAL HIGH (ref 70–99)
Potassium: 3.3 mmol/L — ABNORMAL LOW (ref 3.5–5.1)
Sodium: 138 mmol/L (ref 135–145)
Total Bilirubin: 0.6 mg/dL (ref 0.3–1.2)
Total Protein: 6.5 g/dL (ref 6.5–8.1)

## 2018-04-06 MED ORDER — SODIUM CHLORIDE 0.9 % IV SOLN
1.0000 g | INTRAVENOUS | Status: DC
  Administered 2018-04-06 – 2018-04-07 (×2): 1 g via INTRAVENOUS
  Filled 2018-04-06 (×2): qty 1
  Filled 2018-04-06: qty 10

## 2018-04-06 NOTE — Progress Notes (Signed)
Inpatient Diabetes Program Recommendations  AACE/ADA: New Consensus Statement on Inpatient Glycemic Control (2015)  Target Ranges:  Prepandial:   less than 140 mg/dL      Peak postprandial:   less than 180 mg/dL (1-2 hours)      Critically ill patients:  140 - 180 mg/dL   Results for Alfred Li, Alfred Li (MRN [id number]) as of 04/06/2018 13:14  Ref. Range 04/06/2018 00:13 04/06/2018 04:16 04/06/2018 07:40 04/06/2018 10:09 04/06/2018 11:35  Glucose-Capillary Latest Ref Range: 70 - 99 mg/dL 409164 (H)  2 units NOVOLOG  114 (H)  0 units NOVOLOG  105 (H)  0 units NOVOLOG  204 (H)    25 units LANTUS 192 (H)  2 units NOVOLOG    Results for Alfred Li, Alfred Li (MRN [id number]) as of 04/06/2018 13:14  Ref. Range 04/01/2018 20:47  Hemoglobin A1C Latest Ref Range: 4.8 - 5.6 % 13.9 (H)  Average glucose 352 mg/dl    Admit with: Pancreatitis/ New Diagnosis of DM  Current Insulin Orders: Lantus 25 units daily      Novolog Sensitive Correction Scale/ SSI (0-9 units) Q4 hours     New Diagnosis of DM.  Likely Type 1 DM given age and body habitus.  Note Antibody levels and C-Peptide levels drawn but results came back "Lipemic".    MD- May need to redrawn C-Peptide and GAD antibody levels.  Patient needs Endocrinology follow up after discharge.  Recommend Dr. Talmage CoinJeffrey Kerr [facility].     --Will follow patient during hospitalization--  Ambrose FinlandJeannine Johnston Ezio Wieck RN, MSN, CDE Diabetes Coordinator Inpatient Glycemic Control Team Team Pager: [phone] (8a-5p)

## 2018-04-06 NOTE — Progress Notes (Signed)
Patient ID: Alfred Li, male   DOB: Jun 13, 1996, 22 y.o.   MRN: [id number]                                                                PROGRESS NOTE                                                                                                                                                                                                             Patient Demographics:    Alfred Li, is a 22 y.o. male, DOB - 06/12/1996, XBJ:[id number]  Admit date - 04/01/2018   Admitting Physician Jani Gravel, MD  Outpatient Primary MD for the patient is Patient, No Pcp Per  LOS - 5  Outpatient Specialists:     Chief Complaint  Patient presents with  . Abdominal Pain       Brief Narrative    22 y.o. male, w family hx of diabetes, apparently presents with 4 days of epigastric pain.  Pt denies fever, chills, cp, palp, sob, diarrhea, brbpr.  Pt was seen at [facility] urgent care and tx with zantac earlier today but his abdominal pain worsened and therefore presented to ED for evaluation.  Notes n/v x1 today.  No bloody emesis.    In ED,   CT abd/ pelvis IMPRESSION: 1. Findings consistent with acute pancreatitis. No complication identified. 2. No other acute abnormality within the abdomen and pelvis.  Lipase 109  Glucose 339 Bun 6, Creatinine 0.78 Ast 31, Alt 78, Alk phos 145, T. Bili 0.6 Wbc 15.7, Hgb 15.7, Plt 279  Urinalysis wbc 21-50, rbc 0-5  Pt will be admitted for UTI, pancreatitis and new onset diabetes.     Subjective:    Alfred Li today had slight fever last nite.  Pt states abdominal pain resolved.  Doing well.  Denies n/v, diarrhea, brbpr, black stool.     Assessment  & Plan :    Principal Problem:   Pancreatitis Active Problems:   SIRS (systemic inflammatory response syndrome) (HCC)   High triglycerides   Diabetes (HCC)   LFT elevation    SIRS secondary to acute pancreatitis, pancreatitis secondary to elevated triglycerides -Continue  supportive care, IV fluid, pain control  -CT A/P without gallstones. Denies alcohol use.  -Gemfibrozil, Lipitor, omega-3 ordered for triglyceridemia >5000 -  Blood cultures ordered due to continued fever; suspect this is secondary to pancreatitis. WBC normal. TG improving, lipase normal. But due to continued epigastric pain and fever, repeat CT abdomen  7/23=> negative for pseudocyst   New diagnosis diabetes, uncontrolled with hyperglycemia -Hemoglobin A1c 13.9 -Diabetic coordinator consulted -Discussed with mom and patient that patient will need insulin on discharge home -Likely type 1 diabetes in this young 22 yo patient with BMI 21. Check C-peptide, glutamic acid antibody, Proinsulin - pending  -Lantus, sliding scale insulin  -Blood sugar well controlled today   Elevated LFT -Likely due to pancreatitis -Hepatitis panel negative   Doubt UTI -UA negative for leukocytes, nitrites, rare bacteria. Urine culture negative. Stop Rocephin  Abnormal EKG -EKGs reviewed independently. Sinus tachycardia with peaked T waves anterior leads and questionable T wave elevation lateral leads. Repeat EKG this morning with sinus tachycardia, J-point elevation without significant ST elevation.  -No chest pain. Monitor on telemetry    DVT prophylaxis: Loveox Code Status: Full Family Communication: spoke with mother and patient Disposition Plan: Pending improvement in fever         Lab Results  Component Value Date   PLT 272 04/05/2018    Antibiotics  :  Rocephin 7/19  Anti-infectives (From admission, onward)   Start     Dose/Rate Route Frequency Ordered Stop   04/01/18 2315  cefTRIAXone (ROCEPHIN) 1 g in sodium chloride 0.9 % 100 mL IVPB  Status:  Discontinued     1 g 200 mL/hr over 30 Minutes Intravenous Every 24 hours 04/01/18 2312 04/02/18 0727        Objective:   Vitals:   04/05/18 2030 04/05/18 2134 04/06/18 0514 04/06/18 0515  BP: 127/75  (!) 135/91   Pulse: (!) 108  (!)  102 99  Resp: 18  16   Temp: (!) 100.8 F (38.2 C) 98.1 F (36.7 C) 99.3 F (37.4 C)   TempSrc: Oral Oral Oral   SpO2: 100%  100%   Weight:   68.3 kg (150 lb 8 oz)   Height:        Wt Readings from Last 3 Encounters:  04/06/18 68.3 kg (150 lb 8 oz) (43 %, Z= -0.19)*  12/22/15 71.7 kg (158 lb) (69 %, Z= 0.49)*  12/03/13 74.3 kg (163 lb 12.8 oz) (89 %, Z= 1.25)*   * Growth percentiles are based on CDC (Boys, 2-20 Years) data.     Intake/Output Summary (Last 24 hours) at 04/06/2018 0546 Last data filed at 04/06/2018 0200 Gross per 24 hour  Intake 3000 ml  Output 2 ml  Net 2998 ml     Physical Exam  Awake Alert, Oriented X 3, No new F.N deficits, Normal affect [place].AT,PERRAL Supple Neck,No JVD, No cervical lymphadenopathy appriciated.  Symmetrical Chest wall movement, Good air movement bilaterally, CTAB RRR,No Gallops,Rubs or new Murmurs, No Parasternal Heave +ve B.Sounds, Abd Soft, No tenderness, No organomegaly appriciated, No rebound - guarding or rigidity. No Cyanosis, Clubbing or edema, No new Rash or bruise     Data Review:    CBC Recent Labs  Lab 04/01/18 1803 04/02/18 0124 04/03/18 0527 04/04/18 0455 04/05/18 0503  WBC 15.7* 14.6* 13.2* 9.1 9.0  HGB 15.7 15.2 13.4 11.4* 11.1*  HCT 43.2 42.8 38.8* 33.4* 33.3*  PLT 279 269 291 215 272  MCV 83.9 82.9 85.5 85.2 85.8  MCH 29.7 29.5 29.5 29.1 28.6  MCHC 35.8 35.5 34.5 34.1 33.3  RDW 12.9 13.1 13.5 13.7 14.1    Chemistries  Recent Labs  Lab 04/02/18 0124 04/03/18 0527 04/03/18 1512 04/04/18 0455 04/05/18 0503  NA 136 133* 133* 138 137  K 4.1 3.9 5.1 3.9 4.0  CL 98 100 100 105 105  CO2 24 19* _0 GLUCOSE 298* 205* 185* 127* 166*  BUN <5* 3* 5* 5* 5*  CREATININE 0.71 0.72 0.65 0.64 0.52*  CALCIUM 9.7 8.9 8.9 8.5* 8.7*  AST 22 198* 36 25 52*  ALT 62* 33 27 26 43  ALKPHOS 125 86 88 94 175*  BILITOT 0.4 1.1 2.4* 1.2 1.5*    ------------------------------------------------------------------------------------------------------------------ Recent Labs    04/05/18 0504  TRIG 1,208*    Lab Results  Component Value Date   HGBA1C 13.9 (H) 04/01/2018   ------------------------------------------------------------------------------------------------------------------ No results for input(s): TSH, T4TOTAL, T3FREE, THYROIDAB in the last 72 hours.  Invalid input(s): FREET3 ------------------------------------------------------------------------------------------------------------------ No results for input(s): VITAMINB12, FOLATE, FERRITIN, TIBC, IRON, RETICCTPCT in the last 72 hours.  Coagulation profile No results for input(s): INR, PROTIME in the last 168 hours.  No results for input(s): DDIMER in the last 72 hours.  Cardiac Enzymes No results for input(s): CKMB, TROPONINI, MYOGLOBIN in the last 168 hours.  Invalid input(s): CK ------------------------------------------------------------------------------------------------------------------ No results found for: BNP  Inpatient Medications  Scheduled Meds: . atorvastatin  40 mg Oral q1800  . enoxaparin (LOVENOX) injection  40 mg Subcutaneous Q24H  . gemfibrozil  600 mg Oral BID AC  . insulin aspart  0-9 Units Subcutaneous Q4H  . insulin glargine  25 Units Subcutaneous Daily  . omega-3 acid ethyl esters  1 g Oral BID   Continuous Infusions: . sodium chloride 100 mL/hr at 04/06/18 0521   PRN Meds:.acetaminophen **OR** acetaminophen, HYDROmorphone (DILAUDID) injection, ibuprofen, ondansetron (ZOFRAN) IV  Micro Results Recent Results (from the past 240 hour(s))  Culture, Urine     Status: None   Collection Time: 04/02/18  6:15 AM  Result Value Ref Range Status   Specimen Description   Final    URINE, SUPRAPUBIC Performed at [facility], [place] [address], [place], [place] [address]    Special Requests   Final     NONE Performed at [facility], [place] [address] [place], [place] [address]    Culture   Final    NO GROWTH Performed at [facility], [place] [address], [place], [place] [address]    Report Status 04/03/2018 FINAL  Final  Culture, blood (routine x 2)     Status: None (Preliminary result)   Collection Time: 04/03/18  7:50 AM  Result Value Ref Range Status   Specimen Description   Final    BLOOD RIGHT HAND Performed at [facility] [address] [place], [place] [address]    Special Requests   Final    BOTTLES DRAWN AEROBIC ONLY Blood Culture adequate volume Performed at [facility] [address], [place], [place] [address]    Culture   Final    NO GROWTH 2 DAYS Performed at [facility] [address], [place], [place] [address]    Report Status PENDING  Incomplete  Culture, blood (routine x 2)     Status: None (Preliminary result)   Collection Time: 04/03/18  7:53 AM  Result Value Ref Range Status   Specimen Description   Final    BLOOD RIGHT ARM Performed at [facility] [address], [place],  [address]    Special Requests   Final  BOTTLES DRAWN AEROBIC AND ANAEROBIC Blood Culture adequate volume Performed at [facility] [address], [place], [place] [address]    Culture   Final    NO GROWTH 2 DAYS Performed at [facility] [address], [place], [place] [address]    Report Status PENDING  Incomplete    Radiology Reports Ct Abdomen W Contrast  Result Date: 04/05/2018 CLINICAL DATA:  Pancreatitis EXAM: CT ABDOMEN WITH CONTRAST TECHNIQUE: Multidetector CT imaging of the abdomen was performed using the standard protocol following bolus administration of intravenous contrast. CONTRAST:  16m ISOVUE-300 IOPAMIDOL (ISOVUE-300) INJECTION 61% COMPARISON:  04/01/2018 FINDINGS: Lower chest: Tiny effusions bilaterally. Hepatobiliary: No focal  abnormality within the liver parenchyma. Gallbladder wall thickening evident. No intrahepatic or extrahepatic biliary dilation. Pancreas: Extensive edema/inflammation around the head of pancreas with relatively well preserved peripancreatic fat in the region the pancreatic tail. No features to suggest pancreatic necrosis. No dilatation of the main pancreatic duct. Spleen: No splenomegaly. No focal mass lesion. Adrenals/Urinary Tract: No adrenal nodule or mass. Kidneys unremarkable. Stomach/Bowel: Stomach is nondistended. No gastric wall thickening. No evidence of outlet obstruction. Duodenum tracks through the edema/inflammation in the anterior pararenal space and mild duodenal wall thickening likely secondary. No duodenal obstruction. No small bowel or colonic dilatation within the visualized abdomen. Vascular/Lymphatic: No abdominal aortic aneurysm. Portal vein and superior mesenteric vein are patent. Splenic vein is patent. No complication of the celiac axis or SMA. Other: Fairly extensive retroperitoneal edema around the head of the pancreas, tracking down on the right towards the pelvis. No rim enhancing or organized fluid collection to suggest evolving pseudocyst or abscess. Musculoskeletal: No worrisome lytic or sclerotic osseous abnormality. IMPRESSION: 1. Peripancreatic edema/inflammation with retroperitoneal fluid is more prominent than on the prior study. No organized collection to suggest pseudocyst or abscess. No findings to suggest pancreatic necrosis. 2. Tiny bilateral pleural effusions, new in the interval. 3. No vascular complication in the upper abdomen. Electronically Signed   By: EMisty StanleyM.D.   On: 04/05/2018 14:57   Ct Abdomen Pelvis W Contrast  Result Date: 04/01/2018 CLINICAL DATA:  Initial evaluation for acute right upper quadrant pain, fever. EXAM: CT ABDOMEN AND PELVIS WITH CONTRAST TECHNIQUE: Multidetector CT imaging of the abdomen and pelvis was performed using the standard  protocol following bolus administration of intravenous contrast. CONTRAST:  1069mISOVUE-300 IOPAMIDOL (ISOVUE-300) INJECTION 61% COMPARISON:  None. FINDINGS: Lower chest: Visualized lungs are clear. Hepatobiliary: Liver demonstrates a normal contrast enhanced appearance. Gallbladder within normal limits. No biliary dilatation. Pancreas: Hazy inflammatory stranding about the head and uncinate process of the pancreas, suggesting acute pancreatitis. No loculated peripancreatic collection or other complication identified. Spleen: Unremarkable. Adrenals/Urinary Tract: Adrenal glands are normal. Kidneys equal in size with symmetric enhancement. No nephrolithiasis, hydronephrosis, or focal enhancing renal mass. No hydroureter. Bladder within normal limits. Stomach/Bowel: Stomach within normal limits. Inflammatory stranding surrounds the duodenum due to the adjacent inflammatory process within the pancreas. No evidence for bowel obstruction. Normal appendix. No other acute inflammatory changes seen about the bowels. Vascular/Lymphatic: Normal intravascular enhancement seen throughout the intra-abdominal aorta and its branch vessels. No adenopathy. Reproductive: Prostate normal. Other: No free air. Small volume free fluid within the upper abdomen related to the acute inflammatory changes about the pancreas. Musculoskeletal: No acute osseus abnormality. No worrisome lytic or blastic osseous lesions. IMPRESSION: 1. Findings consistent with acute pancreatitis. No complication identified. 2. No other acute abnormality within the abdomen and pelvis. Electronically Signed   By:  Jeannine Boga M.D.   On: 04/01/2018 22:07    Time Spent in minutes  30   Jani Gravel M.D on 04/06/2018 at 5:46 AM  Between 7am to 7pm - Pager - [phone]   After 7pm go to [email or web address] - password [facility]  [facility] -  Office  [phone]

## 2018-04-07 ENCOUNTER — Inpatient Hospital Stay (HOSPITAL_COMMUNITY): Payer: 59

## 2018-04-07 DIAGNOSIS — R509 Fever, unspecified: Secondary | ICD-10-CM

## 2018-04-07 LAB — COMPREHENSIVE METABOLIC PANEL
ALT: 56 U/L — ABNORMAL HIGH (ref 0–44)
ANION GAP: 9 (ref 5–15)
AST: 38 U/L (ref 15–41)
Albumin: 2.4 g/dL — ABNORMAL LOW (ref 3.5–5.0)
Alkaline Phosphatase: 219 U/L — ABNORMAL HIGH (ref 38–126)
BILIRUBIN TOTAL: 0.8 mg/dL (ref 0.3–1.2)
BUN: 6 mg/dL (ref 6–20)
CHLORIDE: 108 mmol/L (ref 98–111)
CO2: 23 mmol/L (ref 22–32)
Calcium: 8.8 mg/dL — ABNORMAL LOW (ref 8.9–10.3)
Creatinine, Ser: 0.55 mg/dL — ABNORMAL LOW (ref 0.61–1.24)
Glucose, Bld: 139 mg/dL — ABNORMAL HIGH (ref 70–99)
POTASSIUM: 3.6 mmol/L (ref 3.5–5.1)
Sodium: 140 mmol/L (ref 135–145)
TOTAL PROTEIN: 6.7 g/dL (ref 6.5–8.1)

## 2018-04-07 LAB — CBC
HEMATOCRIT: 30.8 % — AB (ref 39.0–52.0)
HEMOGLOBIN: 10.2 g/dL — AB (ref 13.0–17.0)
MCH: 27.8 pg (ref 26.0–34.0)
MCHC: 33.1 g/dL (ref 30.0–36.0)
MCV: 83.9 fL (ref 78.0–100.0)
PLATELETS: 339 10*3/uL (ref 150–400)
RBC: 3.67 MIL/uL — ABNORMAL LOW (ref 4.22–5.81)
RDW: 14 % (ref 11.5–15.5)
WBC: 10.2 10*3/uL (ref 4.0–10.5)

## 2018-04-07 LAB — GLUCOSE, CAPILLARY
GLUCOSE-CAPILLARY: 117 mg/dL — AB (ref 70–99)
GLUCOSE-CAPILLARY: 137 mg/dL — AB (ref 70–99)
GLUCOSE-CAPILLARY: 182 mg/dL — AB (ref 70–99)
GLUCOSE-CAPILLARY: 184 mg/dL — AB (ref 70–99)
GLUCOSE-CAPILLARY: 167 mg/dL — AB (ref 70–99)

## 2018-04-07 LAB — SEDIMENTATION RATE: SED RATE: 112 mm/h — AB (ref 0–16)

## 2018-04-07 LAB — LIPASE, BLOOD: LIPASE: 47 U/L (ref 11–51)

## 2018-04-07 MED ORDER — INSULIN GLARGINE 100 UNIT/ML ~~LOC~~ SOLN: 25.0000 [IU] | Freq: Every day | SUBCUTANEOUS | 11 refills | Status: AC

## 2018-04-07 MED ORDER — INSULIN ASPART 100 UNIT/ML ~~LOC~~ SOLN: SUBCUTANEOUS | 11 refills | Status: DC

## 2018-04-07 MED ORDER — GADOBENATE DIMEGLUMINE 529 MG/ML IV SOLN
15.0000 mL | Freq: Once | INTRAVENOUS | Status: AC | PRN
  Administered 2018-04-07: 14 mL via INTRAVENOUS

## 2018-04-07 MED ORDER — OMEGA-3-ACID ETHYL ESTERS 1 G PO CAPS: 1.0000 g | ORAL_CAPSULE | Freq: Two times a day (BID) | ORAL | 0 refills | Status: AC

## 2018-04-07 MED ORDER — GEMFIBROZIL 600 MG PO TABS: 600.0000 mg | ORAL_TABLET | Freq: Two times a day (BID) | ORAL | 0 refills | Status: AC

## 2018-04-07 NOTE — Progress Notes (Signed)
Met with pt and Mom.  Pt quiet, however, able to perform all tasks as requested and asked good questions.  Mom also present for learning and asked good questions as well.  Educated patient and Mom on insulin pen use at home.  Reviewed all steps of insulin pen including attachment of needle, 2-unit air shot, dialing up dose, giving injection, rotation of injection sites, removing needle, disposal of sharps, storage of unused insulin, disposal of insulin etc.  Patient able to provide successful return demonstration.  Reviewed troubleshooting with insulin pen.  Also reviewed Signs/Symptoms of Hypoglycemia with patient and how to treat Hypoglycemia at home.  Have asked RNs caring for patient to please allow patient to give all injections here in hospital as much as possible for practice.  MD to give patient Rxs for insulin pens and insulin pen needles.    Briefly discussed the difference between Type 1 and Type 2 DM.  Mom asked if Type 1 DM can go away and get better.  Reminded Mom that there is no cure for Type 1 DM and that insulin treatment is life long.  Encouraged pt and Mom to please seek care under an Endocrinologist (gave pt and Mom two different names for local ENDO practices).  NT able to provide CBG meter teaching with pt.  Pt able to successfully check CBG with hospital lancet and meter.  Also discussed the difference between Lantus and Novolog, when to take, how to take.  Encouraged pt to take his insulin and CBG meter and supplies to work and to school and to never store insulin nor meter and supplies in the car.  Insulin and meter must be at room temperature.  Encouraged pt to create a supply kit to keep his CBG meter and insulin and supplies safe at school and work.  Reminded pt he will need to take Lantus once daily (same time everyday) and Novolog around meals using the prescribed SSI regimen.  Patient and Mom stated understanding.      --Will follow patient during  hospitalization--  Wyn Quaker RN, MSN, CDE Diabetes Coordinator Inpatient Glycemic Control Team Team Pager: [phone] (8a-5p)

## 2018-04-07 NOTE — Progress Notes (Signed)
Patient ID: EBENEZER MCCASKEY, male   DOB: 1995/10/24, 22 y.o.   MRN: [id number]                                                                PROGRESS NOTE                                                                                                                                                                                                             Patient Demographics:    Deryk Bozman, is a 22 y.o. male, DOB - 02-09-1996, JIR:[id number]  Admit date - 04/01/2018   Admitting Physician Jani Gravel, MD  Outpatient Primary MD for the patient is Patient, No Pcp Per  LOS - 6  Outpatient Specialists:     Chief Complaint  Patient presents with  . Abdominal Pain       Brief Narrative  22 y.o.male,w family hx of diabetes, apparently presents with 4 days of epigastric pain. Pt denies fever, chills, cp, palp, sob, diarrhea, brbpr. Pt was seen at [facility] urgent care and tx with zantac earlier today but his abdominal pain worsened and therefore presented to ED for evaluation. Notes n/v x1 today. No bloody emesis.   In ED,   CT abd/ pelvis IMPRESSION: 1. Findings consistent with acute pancreatitis. No complication identified. 2. No other acute abnormality within the abdomen and pelvis.  Lipase 109  Glucose 339 Bun 6, Creatinine 0.78 Ast 31, Alt 78, Alk phos 145, T. Bili 0.6 Wbc 15.7, Hgb 15.7, Plt 279  Urinalysis wbc 21-50, rbc 0-5  Pt will be admitted for UTI, pancreatitis and new onset diabetes.     Subjective:    Moishe Schellenberg today is doing well, denies abd pain, n/v, diarrhea, brbpr.   No headache, No chest pain, No new weakness tingling or numbness, No Cough - SOB.    Assessment  & Plan :    Principal Problem:   Pancreatitis Active Problems:   SIRS (systemic inflammatory response syndrome) (HCC)   High triglycerides   Diabetes (HCC)   LFT elevation    Fever Last febrile yesterday afternoon ? Pancreatitis vs UTI Resumed rocephin 1gm iv  qday 7/24 Pt became febrile this afternoon Repeat blood culture pending Check cardiac echo Will consider ID Consult if becomes febrile again   SIRS  secondary to acute pancreatitis, pancreatitis secondary to elevated triglycerides -Continue supportive care, IV fluid, pain control  -CT A/P without gallstones. Denies alcohol use.  -Gemfibrozil, Lipitor, omega-3 ordered for triglyceridemia >5000 -Blood cultures 7/21=> ngtd - fever; suspect this is secondary to pancreatitis per prior physician WBC normal.TG improving, lipase normal. But due to continued epigastric pain and fever, repeat CT abdomen  7/23=> negative for pseudocyst   New diagnosis diabetes, uncontrolled with hyperglycemia -Hemoglobin A1c 13.9 -Diabetic coordinator consulted -Discussed with mom and patient that patient will need insulin on discharge home -Likely type 1 diabetes in this young 21 yo patient with BMI 21. Check C-peptide, glutamic acid antibody, Proinsulin - pending  -Lantus, sliding scale insulin  -Blood sugar well controlled today   Elevated LFT -Likely due to pancreatitis -Hepatitis panelnegative  UTI Resume rocephin If afebrile today, discharge on keflex  Abnormal EKG -EKGs reviewed independently. Sinus tachycardia with peaked T waves anterior leads and questionable T wave elevation lateral leads. Repeat EKG this morning with sinus tachycardia, J-point elevation without significant ST elevation.  -No chest pain. Monitor on telemetry    DVT prophylaxis:Loveox Code Status:Full Family Communication:spoke with mother and patient Disposition Plan:likely home today pending improvement in fever          Lab Results  Component Value Date   PLT 339 04/07/2018    Antibiotics  :  Rocephin   Anti-infectives (From admission, onward)   Start     Dose/Rate Route Frequency Ordered Stop   04/06/18 2000  cefTRIAXone (ROCEPHIN) 1 g in sodium chloride 0.9 % 100 mL IVPB     1 g 200 mL/hr  over 30 Minutes Intravenous Every 24 hours 04/06/18 1750     04/01/18 2315  cefTRIAXone (ROCEPHIN) 1 g in sodium chloride 0.9 % 100 mL IVPB  Status:  Discontinued     1 g 200 mL/hr over 30 Minutes Intravenous Every 24 hours 04/01/18 2312 04/02/18 0727        Objective:   Vitals:   04/06/18 1443 04/06/18 2041 04/06/18 2315 04/07/18 0430  BP: 131/83 130/73  132/81  Pulse: 99 91  86  Resp: 16 18  18  Temp: (!) 100.4 F (38 C) 99.9 F (37.7 C) 99.6 F (37.6 C) 98.5 F (36.9 C)  TempSrc: Oral Oral Oral Oral  SpO2: 99% 100%  100%  Weight:    68.6 kg (151 lb 4.8 oz)  Height:        Wt Readings from Last 3 Encounters:  04/07/18 68.6 kg (151 lb 4.8 oz) (44 %, Z= -0.15)*  12/22/15 71.7 kg (158 lb) (69 %, Z= 0.49)*  12/03/13 74.3 kg (163 lb 12.8 oz) (89 %, Z= 1.25)*   * Growth percentiles are based on CDC (Boys, 2-20 Years) data.     Intake/Output Summary (Last 24 hours) at 04/07/2018 0602 Last data filed at 04/06/2018 2200 Gross per 24 hour  Intake 2180 ml  Output -  Net 2180 ml     Physical Exam  Awake Alert, Oriented X 3, No new F.N deficits, Normal affect [place].AT,PERRAL Supple Neck,No JVD, No cervical lymphadenopathy appriciated.  Symmetrical Chest wall movement, Good air movement bilaterally, CTAB RRR,No Gallops,Rubs or new Murmurs, No Parasternal Heave +ve B.Sounds, Abd Soft, No tenderness, No organomegaly appriciated, No rebound - guarding or rigidity. No Cyanosis, Clubbing or edema, No new Rash or bruise     Data Review:    CBC Recent Labs  Lab 04/03/18 0527 04/04/18 0455 04/05/18 0503 04/06/18 0511 04/07/18   0517  WBC 13.2* 9.1 9.0 8.6 10.2  HGB 13.4 11.4* 11.1* 9.9* 10.2*  HCT 38.8* 33.4* 33.3* 29.0* 30.8*  PLT 291 215 272 298 339  MCV 85.5 85.2 85.8 84.1 83.9  MCH 29.5 29.1 28.6 28.7 27.8  MCHC 34.5 34.1 33.3 34.1 33.1  RDW 13.5 13.7 14.1 14.1 14.0    Chemistries  Recent Labs  Lab 04/03/18 1512 04/04/18 0455 04/05/18 0503 04/06/18 0511  04/07/18 0517  NA 133* 138 137 138 140  K 5.1 3.9 4.0 3.3* 3.6  CL 100 105 105 107 108  CO2 _0 GLUCOSE 185* 127* 166* 116* 139*  BUN 5* 5* 5* 5* 6  CREATININE 0.65 0.64 0.52* 0.50* 0.55*  CALCIUM 8.9 8.5* 8.7* 8.9 8.8*  AST 36 25 52* 61* 38  ALT 27 26 43 62* 56*  ALKPHOS 88 94 175* 213* 219*  BILITOT 2.4* 1.2 1.5* 0.6 0.8   ------------------------------------------------------------------------------------------------------------------ Recent Labs    04/05/18 0504  TRIG 1,208*    Lab Results  Component Value Date   HGBA1C 13.9 (H) 04/01/2018   ------------------------------------------------------------------------------------------------------------------ No results for input(s): TSH, T4TOTAL, T3FREE, THYROIDAB in the last 72 hours.  Invalid input(s): FREET3 ------------------------------------------------------------------------------------------------------------------ No results for input(s): VITAMINB12, FOLATE, FERRITIN, TIBC, IRON, RETICCTPCT in the last 72 hours.  Coagulation profile No results for input(s): INR, PROTIME in the last 168 hours.  No results for input(s): DDIMER in the last 72 hours.  Cardiac Enzymes No results for input(s): CKMB, TROPONINI, MYOGLOBIN in the last 168 hours.  Invalid input(s): CK ------------------------------------------------------------------------------------------------------------------ No results found for: BNP  Inpatient Medications  Scheduled Meds: . atorvastatin  40 mg Oral q1800  . enoxaparin (LOVENOX) injection  40 mg Subcutaneous Q24H  . gemfibrozil  600 mg Oral BID AC  . insulin aspart  0-9 Units Subcutaneous Q4H  . insulin glargine  25 Units Subcutaneous Daily  . omega-3 acid ethyl esters  1 g Oral BID   Continuous Infusions: . sodium chloride 100 mL/hr at 04/06/18 1907  . cefTRIAXone (ROCEPHIN)  IV Stopped (04/06/18 1937)   PRN Meds:.acetaminophen **OR** acetaminophen, HYDROmorphone  (DILAUDID) injection, ibuprofen, ondansetron (ZOFRAN) IV  Micro Results Recent Results (from the past 240 hour(s))  Culture, Urine     Status: None   Collection Time: 04/02/18  6:15 AM  Result Value Ref Range Status   Specimen Description   Final    URINE, SUPRAPUBIC Performed at [facility], [place] [address], [place], [place] [address]    Special Requests   Final    NONE Performed at [facility], [place] [address] [place], [place] [address]    Culture   Final    NO GROWTH Performed at [facility], [place] [address] [place], [place] [address]    Report Status 04/03/2018 FINAL  Final  Culture, blood (routine x 2)     Status: None (Preliminary result)   Collection Time: 04/03/18  7:50 AM  Result Value Ref Range Status   Specimen Description   Final    BLOOD RIGHT HAND Performed at [place] [address], [place], [place] [address]    Special Requests   Final    BOTTLES DRAWN AEROBIC ONLY Blood Culture adequate volume Performed at [place] [address] [place], [place] [address]    Culture   Final    NO GROWTH 3 DAYS Performed at [facility], [place] [address] [place], [place]  [address]    Report Status PENDING  Incomplete  Culture, blood (routine x 2)     Status: None (Preliminary result)   Collection Time: 04/03/18  7:53 AM  Result Value Ref Range Status   Specimen Description   Final    BLOOD RIGHT ARM Performed at [place] [address], [place], [place] [address]    Special Requests   Final    BOTTLES DRAWN AEROBIC AND ANAEROBIC Blood Culture adequate volume Performed at [place] [address], [place], [place] [address]    Culture   Final    NO GROWTH 3 DAYS Performed at [facility], [place] [address], [place], [place] [address]    Report Status PENDING  Incomplete    Radiology Reports Ct Abdomen W  Contrast  Result Date: 04/05/2018 CLINICAL DATA:  Pancreatitis EXAM: CT ABDOMEN WITH CONTRAST TECHNIQUE: Multidetector CT imaging of the abdomen was performed using the standard protocol following bolus administration of intravenous contrast. CONTRAST:  46m ISOVUE-300 IOPAMIDOL (ISOVUE-300) INJECTION 61% COMPARISON:  04/01/2018 FINDINGS: Lower chest: Tiny effusions bilaterally. Hepatobiliary: No focal abnormality within the liver parenchyma. Gallbladder wall thickening evident. No intrahepatic or extrahepatic biliary dilation. Pancreas: Extensive edema/inflammation around the head of pancreas with relatively well preserved peripancreatic fat in the region the pancreatic tail. No features to suggest pancreatic necrosis. No dilatation of the main pancreatic duct. Spleen: No splenomegaly. No focal mass lesion. Adrenals/Urinary Tract: No adrenal nodule or mass. Kidneys unremarkable. Stomach/Bowel: Stomach is nondistended. No gastric wall thickening. No evidence of outlet obstruction. Duodenum tracks through the edema/inflammation in the anterior pararenal space and mild duodenal wall thickening likely secondary. No duodenal obstruction. No small bowel or colonic dilatation within the visualized abdomen. Vascular/Lymphatic: No abdominal aortic aneurysm. Portal vein and superior mesenteric vein are patent. Splenic vein is patent. No complication of the celiac axis or SMA. Other: Fairly extensive retroperitoneal edema around the head of the pancreas, tracking down on the right towards the pelvis. No rim enhancing or organized fluid collection to suggest evolving pseudocyst or abscess. Musculoskeletal: No worrisome lytic or sclerotic osseous abnormality. IMPRESSION: 1. Peripancreatic edema/inflammation with retroperitoneal fluid is more prominent than on the prior study. No organized collection to suggest pseudocyst or abscess. No findings to suggest pancreatic necrosis. 2. Tiny bilateral pleural effusions, new in the  interval. 3. No vascular complication in the upper abdomen. Electronically Signed   By: EMisty StanleyM.D.   On: 04/05/2018 14:57   Ct Abdomen Pelvis W Contrast  Result Date: 04/01/2018 CLINICAL DATA:  Initial evaluation for acute right upper quadrant pain, fever. EXAM: CT ABDOMEN AND PELVIS WITH CONTRAST TECHNIQUE: Multidetector CT imaging of the abdomen and pelvis was performed using the standard protocol following bolus administration of intravenous contrast. CONTRAST:  1032mISOVUE-300 IOPAMIDOL (ISOVUE-300) INJECTION 61% COMPARISON:  None. FINDINGS: Lower chest: Visualized lungs are clear. Hepatobiliary: Liver demonstrates a normal contrast enhanced appearance. Gallbladder within normal limits. No biliary dilatation. Pancreas: Hazy inflammatory stranding about the head and uncinate process of the pancreas, suggesting acute pancreatitis. No loculated peripancreatic collection or other complication identified. Spleen: Unremarkable. Adrenals/Urinary Tract: Adrenal glands are normal. Kidneys equal in size with symmetric enhancement. No nephrolithiasis, hydronephrosis, or focal enhancing renal mass. No hydroureter. Bladder within normal limits. Stomach/Bowel: Stomach within normal limits. Inflammatory stranding surrounds the duodenum due to the adjacent inflammatory process within the pancreas. No evidence for bowel obstruction. Normal appendix. No other acute inflammatory changes seen about the bowels. Vascular/Lymphatic: Normal  intravascular enhancement seen throughout the intra-abdominal aorta and its branch vessels. No adenopathy. Reproductive: Prostate normal. Other: No free air. Small volume free fluid within the upper abdomen related to the acute inflammatory changes about the pancreas. Musculoskeletal: No acute osseus abnormality. No worrisome lytic or blastic osseous lesions. IMPRESSION: 1. Findings consistent with acute pancreatitis. No complication identified. 2. No other acute abnormality within the  abdomen and pelvis. Electronically Signed   By: Benjamin  McClintock M.D.   On: 04/01/2018 22:07   Us Abdomen Limited Ruq  Result Date: 04/06/2018 CLINICAL DATA:  21-year-old male with a history of pancreatitis EXAM: ULTRASOUND ABDOMEN LIMITED RIGHT UPPER QUADRANT COMPARISON:  None. FINDINGS: Gallbladder: Heterogeneously a Co a teary a within the gallbladder lumen, with gallbladder wall thickening/striations, measuring 12 mm-13 mm on the ultrasound. Common bile duct measures 3-4 mm. Sonographic Murphy's sign is recorded as negative. Common bile duct: Diameter: 3 mm-4 mm. Liver: No focal lesion identified. Within normal limits in parenchymal echogenicity. Portal vein is patent on color Doppler imaging with normal direction of blood flow towards the liver. IMPRESSION: The ultrasound is equivocal for acute cholecystitis. The gallbladder lumen is filled with heterogeneous material and there is gallbladder wall thickening, however, sonographic Murphy's sign is recorded as negative. The findings may reflect either secondary inflammatory changes in the presence of a pre-existing pancreatitis (CT sequence shows that the pancreatitis preceded the gallbladder inflammation), or interval development of acute cholecystitis. If there is need for confirmatory testing, HIDA scan may be useful. Electronically Signed   By: Jaime  Wagner D.O.   On: 04/06/2018 15:11    Time Spent in minutes  30   James Kim M.D on 04/07/2018 at 6:02 AM  Between 7am to 7pm - Pager - [phone]    After 7pm go to [email or web address] - password [email or web address]  [facility] -  Office  [phone]     

## 2018-04-07 NOTE — Discharge Instructions (Signed)
Recommend Endocrinology follow up after discharge.   Dr. Talmage CoinJeffrey Kerr with [facility] [phone] or Dr. Carlus Pavlovristina Gherghe with [facility] [phone].  Please ask your PCP or Endocrinologist to redraw the C-Peptide and GAD Antibody blood tests  Fingerstick glucose (sugar) goals for home: Before meals: 80-130 mg/dl 2-Hours after meals: less than 180 mg/dl Hemoglobin [id number] goal: 7% or less  Symptoms of Hypoglycemia: Silly, Sweaty, Shaky Check sugar if you have your meter.  If near or less than 70 mg/dl, treat with 1/2 cup juice or soda or take glucose tablets Check sugar 15 minutes after treatment.  If sugar still near or less than 70 mg/al and symptomatic, treat again and may need a snack with some protein (peanut butter with crackers, etc)  Insulin Pen Instructions:  1. Remove Insulin pen cap and clean pen 1st with alcohol rub and then clean skin 2nd with alcohol rub 2. Twist insulin pen needle onto pen (right tighty) 3. Remove outer cap and inner cap from needle 4. Dial pen to 2 units and perform prime- press pen to zero and make sure liquid (insulin) comes out of the needle 5. Dial pen to your dose and perform injection into your abdomen 6. Hold needle in skin for 10 seconds after injection 7. Remove needle from insulin pen and discard 8. Place cap back on insulin pen and store safely (at room temperature) 9. Store unused pens in refrigerator and can keep opened insulin pen at room temperature (discard used pen after 30 days)

## 2018-04-07 NOTE — Discharge Summary (Signed)
Alfred Li, is a 22 y.o. male  DOB Feb 01, 1996  MRN [id number].  Admission date:  04/01/2018  Admitting Physician  Jani Gravel, MD  Discharge Date:  04/07/2018   Primary MD  Patient, No Pcp Per  Recommendations for primary care physician for things to follow:    Fever unknown origin  (ESR 112) Last febrile yesterday afternoon ? Pancreatitis vs UTI Pt became febrile 7/23 Urine culture 7/20 negative Blood culture 7/21 ngtd Repeat blood culture 7/24 ngtd CT abd/ pelvis 7/23=> negative for necrosis or pseudocyst CXR 7/25=> negative MRI lumbar spine => ? myositis Resumed rocephin 1gm iv qday 7/24=> 7/26 Cardiac echo 7/26=> negative F/u with pcp in 4 days for f/u of pancreatitis and fever  SIRS secondary to acute pancreatitis, pancreatitis secondary to elevated triglycerides -Continue supportive care, IV fluid, pain control  -CT A/P without gallstones. repeat CT abdomen7/23=> negative for pseudocyst Repeat CT abdomen 7/26=> negative for pseudocyst, shows inflammation  New diagnosis diabetes, uncontrolled with hyperglycemia Hemoglobin A1c 13.9 Cont Lantus 25 units [place] qday Cont SSI F/u with pcp in 4 days  Elevated LFT Likely due to pancreatitis Hepatitis panelnegative   Hyperlipidemia Cont fish oil Cont Gemfibrozil DC lipitor     Admission Diagnosis  Hyperglycemia [R73.9] Acute pancreatitis without infection or necrosis, unspecified pancreatitis type [K85.90]   Discharge Diagnosis  Hyperglycemia [R73.9] Acute pancreatitis without infection or necrosis, unspecified pancreatitis type [K85.90]    Principal Problem:   Pancreatitis Active Problems:   SIRS (systemic inflammatory response syndrome) (HCC)   High triglycerides   Diabetes (HCC)   LFT elevation      History reviewed. No pertinent past medical history.  History reviewed. No pertinent surgical  history.     HPI  from the history and physical done on the day of admission:     21 y.o.male,w family hx of diabetes, apparently presents with 4 days of epigastric pain. Pt denies fever, chills, cp, palp, sob, diarrhea, brbpr. Pt was seen at [facility] urgent care and tx with zantac earlier today but his abdominal pain worsened and therefore presented to ED for evaluation. Notes n/v x1 today. No bloody emesis.   In ED,   CT abd/ pelvis IMPRESSION: 1. Findings consistent with acute pancreatitis. No complication identified. 2. No other acute abnormality within the abdomen and pelvis.  Lipase 109  Glucose 339 Bun 6, Creatinine 0.78 Ast 31, Alt 78, Alk phos 145, T. Bili 0.6 Wbc 15.7, Hgb 15.7, Plt 279  Urinalysis wbc 21-50, rbc 0-5  Pt will be admitted for UTI, pancreatitis and new onset diabetes.      Hospital Course:     Pt was admitted for pancreatitis secondary to hypertriglyceridemia due to new onset Type 1 Dm.  Pt was tx with ns iv, and rocephin iv for uti,  When urine culture from 7/19 ngtd rocephin discontinued.  Pt developed fever (low grade) starting on 7/23 Blood culture x2 (7/21) ngtd Blood culture x2 (7/25) ngtd Cardiac echo 7/26=> negative for vegetation Rocephin was  restarted cause source of fever unclear.   ID consulted and recommended discontinuation of abx.  ESR 118 Lipase is currently normal Current thought is that his fever is secondary to pancreatitis Pt denies abd pain, n/v, diarrhea, dysuria, hematuria and requests discharged to home     Follow UP  Follow-up Information    [facility] Follow up in 1 week(s).   Specialty:  Family Medicine Contact information: [address] [place] [phone]           Consults obtained - ID  Discharge Condition: stable  Diet and Activity recommendation: See Discharge Instructions below  Discharge Instructions     Discharge Instructions     Ambulatory referral to Nutrition and Diabetic Education   Complete by:  As directed         Discharge Medications     Allergies as of 04/07/2018   No Known Allergies     Medication List    STOP taking these medications   amoxicillin 500 MG capsule Commonly known as:  AMOXIL   AYR SALINE NASAL NO-DRIP Gel   loperamide 2 MG capsule Commonly known as:  IMODIUM   mupirocin ointment 2 % Commonly known as:  BACTROBAN   oxymetazoline 0.05 % nasal spray Commonly known as:  AFRIN NASAL SPRAY   Pseudoephedrine-Guaifenesin [phone] MG Tb12   ranitidine 150 MG tablet Commonly known as:  ZANTAC     TAKE these medications   acetaminophen 500 MG tablet Commonly known as:  TYLENOL Take 1,000 mg by mouth every 6 (six) hours as needed for moderate pain.   gemfibrozil 600 MG tablet Commonly known as:  LOPID Take 1 tablet (600 mg total) by mouth 2 (two) times daily before a meal.   insulin aspart 100 UNIT/ML injection Commonly known as:  novoLOG Correction coverage: Sensitive (thin, NPO, renal) CBG < 70: implement hypoglycemia protocol CBG 70 - 120: 0 units CBG 121 - 150: 1 unit CBG 151 - 200: 2 units CBG 201 - 250: 3 units CBG 251 - 300: 5 units CBG 301 - 350: 7 units CBG 351 - 400: 9 units CBG > 400: call MD and obtain STAT lab verification   insulin glargine 100 UNIT/ML injection Commonly known as:  LANTUS Inject 0.25 mLs (25 Units total) into the skin daily. Start taking on:  04/08/2018   omega-3 acid ethyl esters 1 g capsule Commonly known as:  LOVAZA Take 1 capsule (1 g total) by mouth 2 (two) times daily.   ondansetron 4 MG tablet Commonly known as:  ZOFRAN Take 4 mg by mouth every 8 (eight) hours as needed for nausea or vomiting.       Major procedures and Radiology Reports - PLEASE review detailed and final reports for all details, in brief -       Ct Abdomen W Contrast  Result Date: 04/05/2018 CLINICAL DATA:  Pancreatitis EXAM: CT ABDOMEN WITH  CONTRAST TECHNIQUE: Multidetector CT imaging of the abdomen was performed using the standard protocol following bolus administration of intravenous contrast. CONTRAST:  42m ISOVUE-300 IOPAMIDOL (ISOVUE-300) INJECTION 61% COMPARISON:  04/01/2018 FINDINGS: Lower chest: Tiny effusions bilaterally. Hepatobiliary: No focal abnormality within the liver parenchyma. Gallbladder wall thickening evident. No intrahepatic or extrahepatic biliary dilation. Pancreas: Extensive edema/inflammation around the head of pancreas with relatively well preserved peripancreatic fat in the region the pancreatic tail. No features to suggest pancreatic necrosis. No dilatation of the main pancreatic duct. Spleen: No splenomegaly. No focal mass lesion. Adrenals/Urinary Tract:  No adrenal nodule or mass. Kidneys unremarkable. Stomach/Bowel: Stomach is nondistended. No gastric wall thickening. No evidence of outlet obstruction. Duodenum tracks through the edema/inflammation in the anterior pararenal space and mild duodenal wall thickening likely secondary. No duodenal obstruction. No small bowel or colonic dilatation within the visualized abdomen. Vascular/Lymphatic: No abdominal aortic aneurysm. Portal vein and superior mesenteric vein are patent. Splenic vein is patent. No complication of the celiac axis or SMA. Other: Fairly extensive retroperitoneal edema around the head of the pancreas, tracking down on the right towards the pelvis. No rim enhancing or organized fluid collection to suggest evolving pseudocyst or abscess. Musculoskeletal: No worrisome lytic or sclerotic osseous abnormality. IMPRESSION: 1. Peripancreatic edema/inflammation with retroperitoneal fluid is more prominent than on the prior study. No organized collection to suggest pseudocyst or abscess. No findings to suggest pancreatic necrosis. 2. Tiny bilateral pleural effusions, new in the interval. 3. No vascular complication in the upper abdomen. Electronically Signed   By:  Misty Stanley M.D.   On: 04/05/2018 14:57   Ct Abdomen Pelvis W Contrast  Result Date: 04/01/2018 CLINICAL DATA:  Initial evaluation for acute right upper quadrant pain, fever. EXAM: CT ABDOMEN AND PELVIS WITH CONTRAST TECHNIQUE: Multidetector CT imaging of the abdomen and pelvis was performed using the standard protocol following bolus administration of intravenous contrast. CONTRAST:  157m ISOVUE-300 IOPAMIDOL (ISOVUE-300) INJECTION 61% COMPARISON:  None. FINDINGS: Lower chest: Visualized lungs are clear. Hepatobiliary: Liver demonstrates a normal contrast enhanced appearance. Gallbladder within normal limits. No biliary dilatation. Pancreas: Hazy inflammatory stranding about the head and uncinate process of the pancreas, suggesting acute pancreatitis. No loculated peripancreatic collection or other complication identified. Spleen: Unremarkable. Adrenals/Urinary Tract: Adrenal glands are normal. Kidneys equal in size with symmetric enhancement. No nephrolithiasis, hydronephrosis, or focal enhancing renal mass. No hydroureter. Bladder within normal limits. Stomach/Bowel: Stomach within normal limits. Inflammatory stranding surrounds the duodenum due to the adjacent inflammatory process within the pancreas. No evidence for bowel obstruction. Normal appendix. No other acute inflammatory changes seen about the bowels. Vascular/Lymphatic: Normal intravascular enhancement seen throughout the intra-abdominal aorta and its branch vessels. No adenopathy. Reproductive: Prostate normal. Other: No free air. Small volume free fluid within the upper abdomen related to the acute inflammatory changes about the pancreas. Musculoskeletal: No acute osseus abnormality. No worrisome lytic or blastic osseous lesions. IMPRESSION: 1. Findings consistent with acute pancreatitis. No complication identified. 2. No other acute abnormality within the abdomen and pelvis. Electronically Signed   By: BJeannine BogaM.D.   On:  04/01/2018 22:07   [place]Abdomen Limited Ruq  Result Date: 04/06/2018 CLINICAL DATA:  22year old male with a history of pancreatitis EXAM: ULTRASOUND ABDOMEN LIMITED RIGHT UPPER QUADRANT COMPARISON:  None. FINDINGS: Gallbladder: Heterogeneously a Co a teary a within the gallbladder lumen, with gallbladder wall thickening/striations, measuring 12 mm-13 mm on the ultrasound. Common bile duct measures 3-4 mm. Sonographic Murphy's sign is recorded as negative. Common bile duct: Diameter: 3 mm-4 mm. Liver: No focal lesion identified. Within normal limits in parenchymal echogenicity. Portal vein is patent on color Doppler imaging with normal direction of blood flow towards the liver. IMPRESSION: The ultrasound is equivocal for acute cholecystitis. The gallbladder lumen is filled with heterogeneous material and there is gallbladder wall thickening, however, sonographic Murphy's sign is recorded as negative. The findings may reflect either secondary inflammatory changes in the presence of a pre-existing pancreatitis (CT sequence shows that the pancreatitis preceded the gallbladder inflammation), or interval development of acute cholecystitis. If there is need  for confirmatory testing, HIDA scan may be useful. Electronically Signed   By: Corrie Mckusick D.O.   On: 04/06/2018 15:11    Micro Results      Recent Results (from the past 240 hour(s))  Culture, Urine     Status: None   Collection Time: 04/02/18  6:15 AM  Result Value Ref Range Status   Specimen Description   Final    URINE, SUPRAPUBIC Performed at [facility] [address], [place], [place] [address]    Special Requests   Final    NONE Performed at [facility], [place] [address], [place], [place] [address]    Culture   Final    NO GROWTH Performed at [facility], [place] [address] [place], [place] [address]    Report Status 04/03/2018 FINAL  Final  Culture, blood (routine x 2)     Status: None  (Preliminary result)   Collection Time: 04/03/18  7:50 AM  Result Value Ref Range Status   Specimen Description   Final    BLOOD RIGHT HAND Performed at [facility] [address], [place], [place] [address]    Special Requests   Final    BOTTLES DRAWN AEROBIC ONLY Blood Culture adequate volume Performed at [facility] [address] [place], [place] [address]    Culture   Final    NO GROWTH 3 DAYS Performed at [facility], [place] [address] [place], [place] [address]    Report Status PENDING  Incomplete  Culture, blood (routine x 2)     Status: None (Preliminary result)   Collection Time: 04/03/18  7:53 AM  Result Value Ref Range Status   Specimen Description   Final    BLOOD RIGHT ARM Performed at [facility] [address] [place], [place] [address]    Special Requests   Final    BOTTLES DRAWN AEROBIC AND ANAEROBIC Blood Culture adequate volume Performed at [facility] [address] [place], [place] [address]    Culture   Final    NO GROWTH 3 DAYS Performed at [facility], [place] [address] [place], [place] [address]    Report Status PENDING  Incomplete       Today   Subjective    Jelan Doenges today states no abdominal pain. No n/v, no diarrhea.  No fever today.     pt has no headache,no chest pain,no new weakness tingling or numbness, feels much better wants to go home today.    Objective   Blood pressure 132/81, pulse 86, temperature 98.5 F (36.9 C), temperature source Oral, resp. rate 18, height '5\' 10"'  (1.778 m), weight 68.6 kg (151 lb 4.8 oz), SpO2 100 %.   Intake/Output Summary (Last 24 hours) at 04/07/2018 1226 Last data filed at 04/07/2018 1044 Gross per 24 hour  Intake 2980 ml  Output -  Net 2980 ml    Exam Awake Alert, Oriented x 3, No new F.N deficits, Normal affect [place].AT,PERRAL Supple Neck,No JVD, No cervical lymphadenopathy appriciated.   Symmetrical Chest wall movement, Good air movement bilaterally, CTAB RRR,No Gallops,Rubs or new Murmurs, No Parasternal Heave +ve B.Sounds, Abd Soft, Non tender, No organomegaly appriciated, No rebound -guarding or rigidity. No Cyanosis, Clubbing or edema, No new Rash or bruise   Data Review   CBC w Diff:  Lab Results  Component Value Date   WBC 10.2 04/07/2018   HGB 10.2 (  L) 04/07/2018   HCT 30.8 (L) 04/07/2018   PLT 339 04/07/2018   LYMPHOPCT 31 04/08/2011   MONOPCT 9 04/08/2011   EOSPCT 3 04/08/2011   BASOPCT 0 04/08/2011    CMP:  Lab Results  Component Value Date   NA 140 04/07/2018   K 3.6 04/07/2018   CL 108 04/07/2018   CO2 23 04/07/2018   BUN 6 04/07/2018   CREATININE 0.55 (L) 04/07/2018   PROT 6.7 04/07/2018   ALBUMIN 2.4 (L) 04/07/2018   BILITOT 0.8 04/07/2018   ALKPHOS 219 (H) 04/07/2018   AST 38 04/07/2018   ALT 56 (H) 04/07/2018  .   Total Time in preparing paper work, data evaluation and todays exam - 27 minutes  Jani Gravel M.D on 04/07/2018 at 12:26 PM  [facility]   Office  [phone]

## 2018-04-07 NOTE — Progress Notes (Signed)
MD via phone given update of Pt's temp 100.5. Maintain current plan of care

## 2018-04-07 NOTE — Progress Notes (Signed)
Inpatient Diabetes Program Recommendations  AACE/ADA: New Consensus Statement on Inpatient Glycemic Control (2015)  Target Ranges:  Prepandial:   less than 140 mg/dL      Peak postprandial:   less than 180 mg/dL (1-2 hours)      Critically ill patients:  140 - 180 mg/dL   Results for Alfred Li, Alfred Li (MRN [id number]) as of 04/07/2018 06:54  Ref. Range 04/06/2018 07:40 04/06/2018 10:09 04/06/2018 11:35 04/06/2018 16:51 04/06/2018 20:39  Glucose-Capillary Latest Ref Range: 70 - 99 mg/dL 086105 (H) 578204 (H) 469192 (H) 149 (H) 170 (H)   Results for Alfred Li, Alfred Li (MRN [id number]) as of 04/07/2018 06:54  Ref. Range 04/06/2018 23:17 04/07/2018 04:28  Glucose-Capillary Latest Ref Range: 70 - 99 mg/dL 244148 (H) 010117 (H)    Admit with: Pancreatitis/ New Diagnosis of DM  Current Insulin Orders: Lantus 25 units daily                                       Novolog Sensitive Correction Scale/ SSI (0-9 units) Q4 hours     New Diagnosis of DM.  Likely Type 1 DM given age and body habitus.  Note Antibody levels and C-Peptide levels drawn but results came back "Lipemic".  May need to redrawn C-Peptide and GAD antibody levels.  Patient needs Endocrinology follow up after discharge.  Re[facility] Talmage [facility]rey Kerr [facility] [phone] or Dr. Carlus Pavlovristina Gherghe with [facility] [phone].    MD- Please make sure to give patient the following Rxs at time of discharge:  1. Lantus Insulin Pen- Order # [id number]  2. Novolog Insulin Pen- Order # [id number]  3. Insulin Pen Needles- Order # [id number]   4. CBG Meter and Supplies- Order # [id number]     --Will follow patient during hospitalization--  Ambrose FinlandJeannine Johnston Knox Holdman RN, MSN, CDE Diabetes Coordinator Inpatient Glycemic Control Team Team Pager: [phone] (8a-5p)

## 2018-04-08 ENCOUNTER — Encounter (HOSPITAL_COMMUNITY): Payer: Self-pay | Admitting: Radiology

## 2018-04-08 ENCOUNTER — Inpatient Hospital Stay (HOSPITAL_COMMUNITY): Payer: 59

## 2018-04-08 DIAGNOSIS — M609 Myositis, unspecified: Secondary | ICD-10-CM

## 2018-04-08 DIAGNOSIS — E109 Type 1 diabetes mellitus without complications: Secondary | ICD-10-CM

## 2018-04-08 DIAGNOSIS — R6510 Systemic inflammatory response syndrome (SIRS) of non-infectious origin without acute organ dysfunction: Secondary | ICD-10-CM

## 2018-04-08 DIAGNOSIS — R509 Fever, unspecified: Secondary | ICD-10-CM

## 2018-04-08 LAB — COMPREHENSIVE METABOLIC PANEL
ALT: 41 U/L (ref 0–44)
ALT: 37 U/L (ref 0–44)
ANION GAP: 9 (ref 5–15)
AST: 26 U/L (ref 15–41)
AST: 32 U/L (ref 15–41)
Albumin: 2.3 g/dL — ABNORMAL LOW (ref 3.5–5.0)
Albumin: 2.7 g/dL — ABNORMAL LOW (ref 3.5–5.0)
Alkaline Phosphatase: 188 U/L — ABNORMAL HIGH (ref 38–126)
Alkaline Phosphatase: 214 U/L — ABNORMAL HIGH (ref 38–126)
Anion gap: 7 (ref 5–15)
BILIRUBIN TOTAL: 0.8 mg/dL (ref 0.3–1.2)
BUN: 5 mg/dL — ABNORMAL LOW (ref 6–20)
BUN: <5 mg/dL — ABNORMAL LOW (ref 6–20)
CHLORIDE: 107 mmol/L (ref 98–111)
CO2: 26 mmol/L (ref 22–32)
CO2: 24 mmol/L (ref 22–32)
Calcium: 9.1 mg/dL (ref 8.9–10.3)
Calcium: 9.1 mg/dL (ref 8.9–10.3)
Chloride: 111 mmol/L (ref 98–111)
Creatinine, Ser: 0.65 mg/dL (ref 0.61–1.24)
Creatinine, Ser: 0.57 mg/dL — ABNORMAL LOW (ref 0.61–1.24)
GFR calc Af Amer: >60 mL/min (ref >60)
GFR calc non Af Amer: >60 mL/min (ref >60)
Glucose, Bld: 136 mg/dL — ABNORMAL HIGH (ref 70–99)
Glucose, Bld: 159 mg/dL — ABNORMAL HIGH (ref 70–99)
POTASSIUM: 3.6 mmol/L (ref 3.5–5.1)
Potassium: 3.3 mmol/L — ABNORMAL LOW (ref 3.5–5.1)
Sodium: 144 mmol/L (ref 135–145)
Sodium: 140 mmol/L (ref 135–145)
TOTAL PROTEIN: 7.3 g/dL (ref 6.5–8.1)
Total Bilirubin: 0.7 mg/dL (ref 0.3–1.2)
Total Protein: 6.6 g/dL (ref 6.5–8.1)

## 2018-04-08 LAB — GLUCOSE, CAPILLARY
Glucose-Capillary: 123 mg/dL — ABNORMAL HIGH (ref 70–99)
Glucose-Capillary: 125 mg/dL — ABNORMAL HIGH (ref 70–99)
Glucose-Capillary: 131 mg/dL — ABNORMAL HIGH (ref 70–99)
Glucose-Capillary: 145 mg/dL — ABNORMAL HIGH (ref 70–99)
Glucose-Capillary: 188 mg/dL — ABNORMAL HIGH (ref 70–99)
Glucose-Capillary: 95 mg/dL (ref 70–99)

## 2018-04-08 LAB — CK TOTAL AND CKMB (NOT AT ARMC)
CK, MB: 2.8 ng/mL (ref 0.5–5.0)
Relative Index: 2 (ref 0.0–2.5)
Total CK: 139 U/L (ref 49–397)

## 2018-04-08 LAB — CBC
HCT: 28.5 % — ABNORMAL LOW (ref 39.0–52.0)
HEMATOCRIT: 31.2 % — AB (ref 39.0–52.0)
Hemoglobin: 9.4 g/dL — ABNORMAL LOW (ref 13.0–17.0)
Hemoglobin: 10.4 g/dL — ABNORMAL LOW (ref 13.0–17.0)
MCH: 27.7 pg (ref 26.0–34.0)
MCH: 28 pg (ref 26.0–34.0)
MCHC: 33 g/dL (ref 30.0–36.0)
MCHC: 33.3 g/dL (ref 30.0–36.0)
MCV: 84.1 fL (ref 78.0–100.0)
MCV: 84.1 fL (ref 78.0–100.0)
Platelets: 386 10*3/uL (ref 150–400)
Platelets: 476 10*3/uL — ABNORMAL HIGH (ref 150–400)
RBC: 3.39 MIL/uL — ABNORMAL LOW (ref 4.22–5.81)
RBC: 3.71 MIL/uL — ABNORMAL LOW (ref 4.22–5.81)
RDW: 14.2 % (ref 11.5–15.5)
RDW: 13.9 % (ref 11.5–15.5)
WBC: 9.6 10*3/uL (ref 4.0–10.5)
WBC: 9.2 10*3/uL (ref 4.0–10.5)

## 2018-04-08 LAB — CULTURE, BLOOD (ROUTINE X 2)
Culture: NO GROWTH
Culture: NO GROWTH
SPECIAL REQUESTS: ADEQUATE
SPECIAL REQUESTS: ADEQUATE

## 2018-04-08 LAB — PROINSULIN/INSULIN RATIO
Insulin: 10 u[IU]/mL
Proinsulin/Insulin Ratio: 51 %
Proinsulin: 34 pmol/L

## 2018-04-08 LAB — ECHOCARDIOGRAM COMPLETE
HEIGHTINCHES: 70 in
Weight: 2411.2 oz

## 2018-04-08 LAB — TSH: TSH: 6.576 u[IU]/mL — AB (ref 0.350–4.500)

## 2018-04-08 LAB — SEDIMENTATION RATE: SED RATE: 118 mm/h — AB (ref 0–16)

## 2018-04-08 MED ORDER — IOPAMIDOL (ISOVUE-300) INJECTION 61%
INTRAVENOUS | Status: AC
  Filled 2018-04-08: qty 30

## 2018-04-08 MED ORDER — IOPAMIDOL (ISOVUE-300) INJECTION 61%
INTRAVENOUS | Status: AC
  Filled 2018-04-08: qty 100

## 2018-04-08 MED ORDER — IOPAMIDOL (ISOVUE-300) INJECTION 61%
15.0000 mL | Freq: Once | INTRAVENOUS | Status: DC | PRN
  Administered 2018-04-08: 30 mL via ORAL
  Filled 2018-04-08: qty 30

## 2018-04-08 MED ORDER — POTASSIUM CHLORIDE CRYS ER 20 MEQ PO TBCR
20.0000 meq | EXTENDED_RELEASE_TABLET | Freq: Once | ORAL | Status: AC
  Administered 2018-04-08: 20 meq via ORAL
  Filled 2018-04-08: qty 1

## 2018-04-08 MED ORDER — IOPAMIDOL (ISOVUE-300) INJECTION 61%
100.0000 mL | Freq: Once | INTRAVENOUS | Status: AC | PRN
  Administered 2018-04-08: 100 mL via INTRAVENOUS

## 2018-04-08 NOTE — Consult Note (Signed)
[facility]    Date of Admission:  04/01/2018   Total days of antibiotics: 2 ceftriaxone               Reason for Consult: Fever   Referring Provider: Maudie Mercury   Assessment: Fever New DM1 Myositis  Plan: 1. Stop ceftriaxone 2. Repeat CT abd 3. Consider stopping gemfibrozil 4. Check HIV RNA 5. Check RPR, GC, Chlamydia  Comment Would suspect drug fever or worsening/unresolved pancreatitis. Reimaging and stopping drugs would be helpful.  His UCx from adm was (-), he does not have UTI.  ID available as need over the weekend.    Thank you so much for this interesting consult,  Principal Problem:   Pancreatitis Active Problems:   SIRS (systemic inflammatory response syndrome) (HCC)   High triglycerides   Diabetes (HCC)   LFT elevation   Fever   . enoxaparin (LOVENOX) injection  40 mg Subcutaneous Q24H  . gemfibrozil  600 mg Oral BID AC  . insulin aspart  0-9 Units Subcutaneous Q4H  . insulin glargine  25 Units Subcutaneous Daily  . omega-3 acid ethyl esters  1 g Oral BID    HPI: Alfred Li is a 22 y.o. male adm on 7-19 with crampy abd pain, n/v, chills. He was afebrile and his WBC was 15.7. His Glc was 339 (new!), his abd CT was consistent with acute pancreatitis. His triglycerides were > 5000. A1C 13.9.  He was also noted to have pyuria and was dx with UTI (Cx -).  He was started on lipitor, gemfibrozil, insulin as well as ceftriaxone.  This was stopped on 7-20 as his nitrate, LE were (-).  He had fever in hospital, improving by 7-23 (100.8). His ceftriaxone was resumed on 7-24. By 7-25 his temp returned to 101.4.  As part of his w/u he had MRI spine due to back pain. This showed: 1. Mild edema with enhancement involving the interspinous soft tissues at L2-3 through L5-S1, with extension into the adjacent lower paraspinous musculature. Findings of uncertain significance, but suggestive of possible myositis. This could be either  infectious or inflammatory in nature. Possible muscular injury/strain could also be considered. No discrete collections identified. 2. Mild edema along the anterior margin of the right psoas muscle related to the previously identified acute inflammatory process about the pancreas.    Review of Systems: Review of Systems  Constitutional: Positive for fever and weight loss.  Eyes: Positive for blurred vision.  Respiratory: Positive for cough and sputum production. Negative for shortness of breath.   Gastrointestinal: Negative for abdominal pain, constipation and diarrhea.  Genitourinary: Negative for dysuria.  Musculoskeletal: Positive for back pain.  Please see HPI. All other systems reviewed and negative.   History reviewed. No pertinent past medical history. Diabetes+  Social History   Tobacco Use  . Smoking status: Never Smoker  . Smokeless tobacco: Never Used  Substance Use Topics  . Alcohol use: No  . Drug use: No    History reviewed. No pertinent family history.   Medications:  Scheduled: . enoxaparin (LOVENOX) injection  40 mg Subcutaneous Q24H  . gemfibrozil  600 mg Oral BID AC  . insulin aspart  0-9 Units Subcutaneous Q4H  . insulin glargine  25 Units Subcutaneous Daily  . omega-3 acid ethyl esters  1 g Oral BID    Abtx:  Anti-infectives (From admission, onward)   Start     Dose/Rate Route Frequency Ordered  Stop   04/06/18 2000  cefTRIAXone (ROCEPHIN) 1 g in sodium chloride 0.9 % 100 mL IVPB     1 g 200 mL/hr over 30 Minutes Intravenous Every 24 hours 04/06/18 1750     04/01/18 2315  cefTRIAXone (ROCEPHIN) 1 g in sodium chloride 0.9 % 100 mL IVPB  Status:  Discontinued     1 g 200 mL/hr over 30 Minutes Intravenous Every 24 hours 04/01/18 2312 04/02/18 0727        OBJECTIVE: Blood pressure 108/63, pulse 76, temperature (!) 97.5 F (36.4 C), resp. rate 16, height '5\' 10"'$  (1.778 m), weight 68.4 kg (150 lb 11.2 oz), SpO2 100 %.  Physical Exam    Constitutional: He is oriented to person, place, and time. He appears well-developed.  Non-toxic appearance.  HENT:  Mouth/Throat: Oropharynx is clear and moist. No oropharyngeal exudate.  Eyes: Pupils are equal, round, and reactive to light. EOM are normal. No scleral icterus.  Cardiovascular: Normal rate, regular rhythm and normal heart sounds.  No murmur heard. Pulmonary/Chest: Effort normal and breath sounds normal. No respiratory distress.  Abdominal: Soft. Normal appearance and bowel sounds are normal. He exhibits no distension. There is no tenderness.  Musculoskeletal:       Arms: Neurological: He is alert and oriented to person, place, and time.  Skin: Skin is warm and dry. No rash noted.  Psychiatric: He has a normal mood and affect.    Lab Results Results for orders placed or performed during the hospital encounter of 04/01/18 (from the past 48 hour(s))  Glucose, capillary     Status: Abnormal   Collection Time: 04/06/18  4:51 PM  Result Value Ref Range   Glucose-Capillary 149 (H) 70 - 99 mg/dL  Glucose, capillary     Status: Abnormal   Collection Time: 04/06/18  8:39 PM  Result Value Ref Range   Glucose-Capillary 170 (H) 70 - 99 mg/dL   Comment 1 Notify RN   Glucose, capillary     Status: Abnormal   Collection Time: 04/06/18 11:17 PM  Result Value Ref Range   Glucose-Capillary 148 (H) 70 - 99 mg/dL   Comment 1 Notify RN   Glucose, capillary     Status: Abnormal   Collection Time: 04/07/18  4:28 AM  Result Value Ref Range   Glucose-Capillary 117 (H) 70 - 99 mg/dL  CBC     Status: Abnormal   Collection Time: 04/07/18  5:17 AM  Result Value Ref Range   WBC 10.2 4.0 - 10.5 K/uL   RBC 3.67 (L) 4.22 - 5.81 MIL/uL   Hemoglobin 10.2 (L) 13.0 - 17.0 g/dL   HCT 30.8 (L) 39.0 - 52.0 %   MCV 83.9 78.0 - 100.0 fL   MCH 27.8 26.0 - 34.0 pg   MCHC 33.1 30.0 - 36.0 g/dL   RDW 14.0 11.5 - 15.5 %   Platelets 339 150 - 400 K/uL    Comment: Performed at [facility], [place] [address] [place], [place] [address]  Comprehensive metabolic panel     Status: Abnormal   Collection Time: 04/07/18  5:17 AM  Result Value Ref Range   Sodium 140 135 - 145 mmol/L   Potassium 3.6 3.5 - 5.1 mmol/L   Chloride 108 98 - 111 mmol/L   CO2 23 22 - 32 mmol/L   Glucose, Bld 139 (H) 70 - 99 mg/dL   BUN 6 6 - 20 mg/dL   Creatinine, Ser 0.55 (L) 0.61 - 1.24 mg/dL  Calcium 8.8 (L) 8.9 - 10.3 mg/dL   Total Protein 6.7 6.5 - 8.1 g/dL   Albumin 2.4 (L) 3.5 - 5.0 g/dL   AST 38 15 - 41 U/L   ALT 56 (H) 0 - 44 U/L   Alkaline Phosphatase 219 (H) 38 - 126 U/L   Total Bilirubin 0.8 0.3 - 1.2 mg/dL   GFR calc non Af Amer >60 >60 mL/min   GFR calc Af Amer >60 >60 mL/min    Comment: (NOTE) The eGFR has been calculated using the CKD EPI equation. This calculation has not been validated in all clinical situations. eGFR's persistently <60 mL/min signify possible Chronic Kidney Disease.    Anion gap 9 5 - 15    Comment: Performed at [facility], [place] [address] [place], [place] [address]  Glucose, capillary     Status: Abnormal   Collection Time: 04/07/18  7:39 AM  Result Value Ref Range   Glucose-Capillary 137 (H) 70 - 99 mg/dL  Glucose, capillary     Status: Abnormal   Collection Time: 04/07/18 11:35 AM  Result Value Ref Range   Glucose-Capillary 182 (H) 70 - 99 mg/dL  Lipase, blood     Status: None   Collection Time: 04/07/18  1:34 PM  Result Value Ref Range   Lipase 47 11 - 51 U/L    Comment: Performed at [facility], [place] [address], [place], [place] [address]  Sedimentation rate     Status: Abnormal   Collection Time: 04/07/18  1:34 PM  Result Value Ref Range   Sed Rate 112 (H) 0 - 16 mm/hr    Comment: Performed at [facility], [place] [address], [place], [place] [address]  Culture, blood (routine x 2)     Status: None (Preliminary result)   Collection Time: 04/07/18  1:41 PM  Result Value Ref Range    Specimen Description      BLOOD LEFT ANTECUBITAL Performed at [facility] [address], [place], [place] [address]    Special Requests      BOTTLES DRAWN AEROBIC AND ANAEROBIC Blood Culture adequate volume Performed at [facility] [address], [place], [place] [address]    Culture      NO GROWTH < 24 HOURS Performed at [facility] [address], [place], [place] [address]    Report Status PENDING   Culture, blood (routine x 2)     Status: None (Preliminary result)   Collection Time: 04/07/18  1:46 PM  Result Value Ref Range   Specimen Description      BLOOD LEFT FOREARM Performed at [facility] [address] [place], [place] [address]    Special Requests      BOTTLES DRAWN AEROBIC AND ANAEROBIC Blood Culture adequate volume Performed at [facility] [address] [place], [place] [address]    Culture      NO GROWTH < 24 HOURS Performed at [facility] [address], [place], [place] [address]    Report Status PENDING   Glucose, capillary     Status: Abnormal   Collection Time: 04/07/18  4:57 PM  Result Value Ref Range   Glucose-Capillary 184 (H) 70 - 99 mg/dL  Glucose, capillary     Status: Abnormal   Collection Time: 04/07/18  9:17 PM  Result Value Ref Range   Glucose-Capillary 167 (H) 70 - 99 mg/dL  Glucose, capillary  Status: Abnormal   Collection Time: 04/08/18 12:58 AM  Result Value Ref Range   Glucose-Capillary 145 (H) 70 - 99 mg/dL  Glucose, capillary     Status: Abnormal   Collection Time: 04/08/18  4:13 AM  Result Value Ref Range   Glucose-Capillary 123 (H) 70 - 99 mg/dL  CBC     Status: Abnormal   Collection Time: 04/08/18  4:49 AM  Result Value Ref Range   WBC 9.6 4.0 - 10.5 K/uL    Comment: WHITE COUNT CONFIRMED ON SMEAR   RBC 3.39 (L) 4.22 - 5.81 MIL/uL   Hemoglobin 9.4 (L) 13.0 - 17.0 g/dL   HCT 28.5 (L) 39.0 - 52.0 %   MCV 84.1 78.0 -  100.0 fL   MCH 27.7 26.0 - 34.0 pg   MCHC 33.0 30.0 - 36.0 g/dL   RDW 14.2 11.5 - 15.5 %   Platelets 386 150 - 400 K/uL    Comment: REPEATED TO VERIFY SPECIMEN CHECKED FOR CLOTS PLATELET COUNT CONFIRMED BY SMEAR Performed at [facility], [place] [address] [place], [place] [address]   Comprehensive metabolic panel     Status: Abnormal   Collection Time: 04/08/18  4:49 AM  Result Value Ref Range   Sodium 144 135 - 145 mmol/L   Potassium 3.3 (L) 3.5 - 5.1 mmol/L   Chloride 111 98 - 111 mmol/L   CO2 26 22 - 32 mmol/L   Glucose, Bld 136 (H) 70 - 99 mg/dL   BUN 5 (L) 6 - 20 mg/dL   Creatinine, Ser 0.65 0.61 - 1.24 mg/dL   Calcium 9.1 8.9 - 10.3 mg/dL   Total Protein 6.6 6.5 - 8.1 g/dL   Albumin 2.3 (L) 3.5 - 5.0 g/dL   AST 26 15 - 41 U/L   ALT 41 0 - 44 U/L   Alkaline Phosphatase 188 (H) 38 - 126 U/L   Total Bilirubin 0.7 0.3 - 1.2 mg/dL   GFR calc non Af Amer >60 >60 mL/min   GFR calc Af Amer >60 >60 mL/min    Comment: (NOTE) The eGFR has been calculated using the CKD EPI equation. This calculation has not been validated in all clinical situations. eGFR's persistently <60 mL/min signify possible Chronic Kidney Disease.    Anion gap 7 5 - 15    Comment: Performed at [facility], [place] [address], [place], [place] [address]  Glucose, capillary     Status: Abnormal   Collection Time: 04/08/18  7:34 AM  Result Value Ref Range   Glucose-Capillary 125 (H) 70 - 99 mg/dL  Glucose, capillary     Status: Abnormal   Collection Time: 04/08/18 11:27 AM  Result Value Ref Range   Glucose-Capillary 188 (H) 70 - 99 mg/dL      Component Value Date/Time   SDES  04/07/2018 1346    BLOOD LEFT FOREARM Performed at [facility], [place] [address] [place], [place] [address]    SPECREQUEST  04/07/2018 1346    BOTTLES DRAWN AEROBIC AND ANAEROBIC Blood Culture adequate volume Performed at [facility] [address],  [place], [place] [address]    CULT  04/07/2018 1346    NO GROWTH < 24 HOURS Performed at [facility] [address], [place], [place] [address]    REPTSTATUS PENDING 04/07/2018 1346   Dg Chest 2 View  Result Date: 04/07/2018 CLINICAL DATA:  Mild fever. Recent history of hospital admission for abdominal pain EXAM:  CHEST - 2 VIEW COMPARISON:  04/08/2011 FINDINGS: The heart size and mediastinal contours are within normal limits. Both lungs are clear. No pleural effusion or pneumothorax. The visualized skeletal structures are unremarkable. IMPRESSION: Normal chest radiographs. Electronically Signed   By: Lajean Manes M.D.   On: 04/07/2018 15:46   Mr Lumbar Spine W Wo Contrast  Result Date: 04/07/2018 CLINICAL DATA:  Initial evaluation for fever of unknown origin. EXAM: MRI LUMBAR SPINE WITHOUT AND WITH CONTRAST TECHNIQUE: Multiplanar and multiecho pulse sequences of the lumbar spine were obtained without and with intravenous contrast. CONTRAST:  67m MULTIHANCE GADOBENATE DIMEGLUMINE 529 MG/ML IV SOLN COMPARISON:  Prior CT from 04/05/2018 FINDINGS: Segmentation: Normal segmentation. Lowest well-formed disc labeled the L5-S1 level. Alignment: Straightening of the normal lumbar lordosis. No listhesis. Vertebrae: Vertebral body heights maintained without evidence for acute or chronic fracture. Bone marrow signal intensity within normal limits. No discrete or worrisome osseous lesions. No abnormal marrow edema. No findings to suggest osteomyelitis discitis. Conus medullaris and cauda equina: Conus extends to the L1 level. Conus and cauda equina appear normal. Paraspinal and other soft tissues: Mild edema seen involving the anterior margin of the right psoas muscle, likely related to the acute inflammatory changes seen about the pancreas on prior CT. Mild edema with enhancement present within the inter spinous soft tissues at L2-3 through L5-S1. Edema and enhancement within the adjacent posterior paraspinous  musculature (series 4, image 5). Finding of uncertain significance, but suggestive of possible myositis. This could be either infectious or inflammatory in nature. Possible injury/strain could also be considered. No discrete collection or other abnormality. No evidence for septic arthritis involving the adjacent facets as a source. Disc levels: No significant disc pathology seen within the lumbar spine. No disc bulge or disc protrusion. No canal or foraminal stenosis. No impingement. Are well hydrated with preserved disc height. IMPRESSION: 1. Mild edema with enhancement involving the interspinous soft tissues at L2-3 through L5-S1, with extension into the adjacent lower paraspinous musculature. Findings of uncertain significance, but suggestive of possible myositis. This could be either infectious or inflammatory in nature. Possible muscular injury/strain could also be considered. No discrete collections identified. 2. Mild edema along the anterior margin of the right psoas muscle related to the previously identified acute inflammatory process about the pancreas. 3. Otherwise negative MRI of the lumbar spine. Electronically Signed   By: BJeannine BogaM.D.   On: 04/07/2018 20:38   UKoreaAbdomen Limited Ruq  Result Date: 04/06/2018 CLINICAL DATA:  22year old male with a history of pancreatitis EXAM: ULTRASOUND ABDOMEN LIMITED RIGHT UPPER QUADRANT COMPARISON:  None. FINDINGS: Gallbladder: Heterogeneously a Co a teary a within the gallbladder lumen, with gallbladder wall thickening/striations, measuring 12 mm-13 mm on the ultrasound. Common bile duct measures 3-4 mm. Sonographic Murphy's sign is recorded as negative. Common bile duct: Diameter: 3 mm-4 mm. Liver: No focal lesion identified. Within normal limits in parenchymal echogenicity. Portal vein is patent on color Doppler imaging with normal direction of blood flow towards the liver. IMPRESSION: The ultrasound is equivocal for acute cholecystitis. The  gallbladder lumen is filled with heterogeneous material and there is gallbladder wall thickening, however, sonographic Murphy's sign is recorded as negative. The findings may reflect either secondary inflammatory changes in the presence of a pre-existing pancreatitis (CT sequence shows that the pancreatitis preceded the gallbladder inflammation), or interval development of acute cholecystitis. If there is need for confirmatory testing, HIDA scan may be useful. Electronically Signed   By: JCorrie MckusickD.O.  On: 04/06/2018 15:11   Recent Results (from the past 240 hour(s))  Culture, Urine     Status: None   Collection Time: 04/02/18  6:15 AM  Result Value Ref Range Status   Specimen Description   Final    URINE, SUPRAPUBIC Performed at [facility] [address], [place], [place] [address]    Special Requests   Final    NONE Performed at [facility], [place] [address], [place], [place] [address]    Culture   Final    NO GROWTH Performed at [facility], [place] [address] [place], [place] [address]    Report Status 04/03/2018 FINAL  Final  Culture, blood (routine x 2)     Status: None   Collection Time: 04/03/18  7:50 AM  Result Value Ref Range Status   Specimen Description   Final    BLOOD RIGHT HAND Performed at [facility] [address], [place], [place] [address]    Special Requests   Final    BOTTLES DRAWN AEROBIC ONLY Blood Culture adequate volume Performed at [facility] [address], [place], [place] [address]    Culture   Final    NO GROWTH 5 DAYS Performed at [facility], [place] [address] [place], [place] [address]    Report Status 04/08/2018 FINAL  Final  Culture, blood (routine x 2)     Status: None   Collection Time: 04/03/18  7:53 AM  Result Value Ref Range Status   Specimen Description   Final    BLOOD RIGHT ARM Performed at [facility]  [address], [place], [place] [address]    Special Requests   Final    BOTTLES DRAWN AEROBIC AND ANAEROBIC Blood Culture adequate volume Performed at [facility] [address] [place], [place] [address]    Culture   Final    NO GROWTH 5 DAYS Performed at [facility], [place] [address], [place], [place] [address]    Report Status 04/08/2018 FINAL  Final  Culture, blood (routine x 2)     Status: None (Preliminary result)   Collection Time: 04/07/18  1:41 PM  Result Value Ref Range Status   Specimen Description   Final    BLOOD LEFT ANTECUBITAL Performed at [facility] [address], [place], [place] [address]    Special Requests   Final    BOTTLES DRAWN AEROBIC AND ANAEROBIC Blood Culture adequate volume Performed at [facility] [address], [place], [place] [address]    Culture   Final    NO GROWTH < 24 HOURS Performed at [facility] [address], [place], [place] [address]    Report Status PENDING  Incomplete  Culture, blood (routine x 2)     Status: None (Preliminary result)   Collection Time: 04/07/18  1:46 PM  Result Value Ref Range Status   Specimen Description   Final    BLOOD LEFT FOREARM Performed at [facility] [address], [place], [place] [address]    Special Requests   Final    BOTTLES DRAWN AEROBIC AND ANAEROBIC Blood Culture adequate volume Performed at [facility] [address] [place], [place] [address]    Culture   Final    NO GROWTH < 24 HOURS Performed at [facility] [address], [place], [place] [address]  Report Status PENDING  Incomplete    Microbiology: Recent Results (from the past 240 hour(s))  Culture, Urine     Status: None   Collection Time: 04/02/18  6:15 AM  Result Value Ref Range Status   Specimen Description   Final    URINE, SUPRAPUBIC Performed at [facility] [address], [place], [place] [address]    Special Requests   Final    NONE Performed at [facility], [place] [address] [place], [place] [address]    Culture   Final    NO GROWTH Performed at [facility], [place] [address], [place], [place] [address]    Report Status 04/03/2018 FINAL  Final  Culture, blood (routine x 2)     Status: None   Collection Time: 04/03/18  7:50 AM  Result Value Ref Range Status   Specimen Description   Final    BLOOD RIGHT HAND Performed at [facility] [address], [place], [place] [address]    Special Requests   Final    BOTTLES DRAWN AEROBIC ONLY Blood Culture adequate volume Performed at [facility] [address], [place], [place] [address]    Culture   Final    NO GROWTH 5 DAYS Performed at [facility], [place] [address], [place], [place] [address]    Report Status 04/08/2018 FINAL  Final  Culture, blood (routine x 2)     Status: None   Collection Time: 04/03/18  7:53 AM  Result Value Ref Range Status   Specimen Description   Final    BLOOD RIGHT ARM Performed at [facility] [address] [place], [place] [address]    Special Requests   Final    BOTTLES DRAWN AEROBIC AND ANAEROBIC Blood Culture adequate volume Performed at [facility] [address], [place], [place] [address]    Culture   Final    NO GROWTH 5 DAYS Performed at [facility], [place] [address] [place], [place] [address]    Report Status 04/08/2018 FINAL  Final  Culture, blood (routine x 2)     Status: None (Preliminary result)   Collection Time: 04/07/18  1:41 PM  Result Value Ref Range Status   Specimen Description   Final    BLOOD LEFT ANTECUBITAL Performed at [facility] [address] [place], [place] [address]    Special Requests   Final    BOTTLES DRAWN AEROBIC AND ANAEROBIC Blood Culture adequate volume Performed at [facility] [address] [place], [place] [address]    Culture   Final    NO GROWTH < 24 HOURS Performed at [facility] [address] [place], [place] [address]    Report Status PENDING  Incomplete  Culture, blood (routine x 2)     Status: None (Preliminary result)   Collection Time: 04/07/18  1:46 PM  Result Value Ref Range Status   Specimen Description   Final    BLOOD LEFT FOREARM Performed at [facility] [address] [place], [place] [address]    Special Requests   Final    BOTTLES DRAWN AEROBIC AND ANAEROBIC Blood Culture adequate volume Performed at [facility] [address] [place], [place] [address]    Culture   Final    NO GROWTH < 24 HOURS Performed at [facility]  [address] [place], [place] [address]    Report Status PENDING  Incomplete    Radiographs and labs were personally reviewed by me.   Bobby Rumpf, MD [facility] [phone] 04/08/2018, 12:52 PM

## 2018-04-08 NOTE — Progress Notes (Signed)
  Echocardiogram 2D Echocardiogram has been performed.  Elyssia Strausser G Brach Birdsall 04/08/2018, 4:02 PM

## 2018-04-08 NOTE — Progress Notes (Signed)
Patient ID: Alfred Li, male   DOB: 1996/08/24, 22 y.o.   MRN: [id number]                                                                PROGRESS NOTE                                                                                                                                                                                                             Patient Demographics:    Alfred Li, is a 22 y.o. male, DOB - [birth year], IPJ:[id number]  Admit date - 04/01/2018   Admitting Physician Jani Gravel, MD  Outpatient Primary MD for the patient is Patient, No Pcp Per  LOS - 7  Outpatient Specialists:   Chief Complaint  Patient presents with  . Abdominal Pain       Brief Narrative    22 y.o.male,w family hx of diabetes, apparently presents with 4 days of epigastric pain. Pt denies fever, chills, cp, palp, sob, diarrhea, brbpr. Pt was seen at [facility] urgent care and tx with zantac earlier today but his abdominal pain worsened and therefore presented to ED for evaluation. Notes n/v x1 today. No bloody emesis.   In ED,   CT abd/ pelvis IMPRESSION: 1. Findings consistent with acute pancreatitis. No complication identified. 2. No other acute abnormality within the abdomen and pelvis.  Lipase 109  Glucose 339 Bun 6, Creatinine 0.78 Ast 31, Alt 78, Alk phos 145, T. Bili 0.6 Wbc 15.7, Hgb 15.7, Plt 279  Urinalysis wbc 21-50, rbc 0-5  Pt will be admitted for UTI, pancreatitis and new onset diabetes.       Subjective:    Alfred Li today is afebrile this AM but had temp up to 101.4 yesterday afternoon.  Pt noted some lower back pain yesterday, and MRI L spine  => ? Myositis,  ESR 112.  Cardiac echo pending blood culture ngtd. Pt denies cough, cp, palp, sob, n/v, diarrhea, dysuria.     Assessment  & Plan :    Principal Problem:   Pancreatitis Active Problems:   SIRS (systemic inflammatory response syndrome) (HCC)   High triglycerides   Diabetes  (HCC)   LFT elevation   Fever     Fever unknown origin  (ESR 112) Last febrile yesterday afternoon ?  Pancreatitis vs UTI Pt became febrile 7/23 Urine culture 7/20 negative Blood culture 7/21 ngtd Repeat blood culture 7/24 ngtd CT abd/ pelvis 7/23=> negative for necrosis or pseudocyst CXR 7/25=> negative MRI lumbar spine => ? myositis Resumed rocephin 1gm iv qday 7/24 Cardiac echo ordered ID consult to assist with Fever unclear source.  Pt reluctant to have blood draw this am Will try to get cbc, cmp, tsh later today  SIRS secondary to acute pancreatitis, pancreatitis secondary to elevated triglycerides -Continue supportive care, IV fluid, pain control  -CT A/P without gallstones. Denies alcohol use.  -Gemfibrozil, Lipitor, omega-3 ordered for triglyceridemia >5000 -Blood cultures 7/21=> ngtd - fever; suspect this is secondary to pancreatitis per prior physician WBC normal.TG improving, lipase normal. But due to continued epigastric pain and fever, repeat CT abdomen7/23=> negative for pseudocyst  New diagnosis diabetes, uncontrolled with hyperglycemia -Hemoglobin A1c 13.9 -Diabetic coordinator consulted -Discussed with mom and patient that patient will need insulin on discharge home -Likely type 1 diabetes in this young 22 yo patient with BMI 21. Check C-peptide, glutamic acid antibody, Proinsulin - pending  -Lantus, sliding scale insulin  -Blood sugar well controlled today   Elevated LFT -Likely due to pancreatitis -Hepatitis panelnegative  UTI Resume rocephin 7/24  Hyperlipidemia Cont fish oil Cont Gemfibrozil DC lipitor Check cpk  Abnormal EKG -EKGs reviewed independently. Sinus tachycardia with peaked T waves anterior leads and questionable T wave elevation lateral leads. Repeat EKG this morning with sinus tachycardia, J-point elevation without significant ST elevation.  -No chest pain. Monitor on telemetry       Code Status :  FULL  CODE  Family Communication  : w patient and mother  Disposition Plan  : home when afebrile  Barriers For Discharge :   Consults  :  ID  Procedures  :   DVT Prophylaxis  :  Lovenox -   Lab Results  Component Value Date   PLT 386 04/08/2018    Antibiotics  :  Rocephin 7/24=>   Anti-infectives (From admission, onward)   Start     Dose/Rate Route Frequency Ordered Stop   04/06/18 2000  cefTRIAXone (ROCEPHIN) 1 g in sodium chloride 0.9 % 100 mL IVPB     1 g 200 mL/hr over 30 Minutes Intravenous Every 24 hours 04/06/18 1750     04/01/18 2315  cefTRIAXone (ROCEPHIN) 1 g in sodium chloride 0.9 % 100 mL IVPB  Status:  Discontinued     1 g 200 mL/hr over 30 Minutes Intravenous Every 24 hours 04/01/18 2312 04/02/18 0727        Objective:   Vitals:   04/07/18 1422 04/07/18 2114 04/07/18 2300 04/08/18 0417  BP:  (!) 147/81  108/63  Pulse:  (!) 103  76  Resp:  20  16  Temp: 98.5 F (36.9 C) (!) 101.4 F (38.6 C) (!) 100.9 F (38.3 C) (!) 97.5 F (36.4 C)  TempSrc: Oral  Oral   SpO2:  100%  100%  Weight:    68.4 kg (150 lb 11.2 oz)  Height:        Wt Readings from Last 3 Encounters:  04/08/18 68.4 kg (150 lb 11.2 oz) (43 %, Z= -0.18)*  12/22/15 71.7 kg (158 lb) (69 %, Z= 0.49)*  12/03/13 74.3 kg (163 lb 12.8 oz) (89 %, Z= 1.25)*   * Growth percentiles are based on CDC (Boys, 2-20 Years) data.     Intake/Output Summary (Last 24 hours) at 04/08/2018 0741 Last data filed at  04/08/2018 0600 Gross per 24 hour  Intake 2440 ml  Output -  Net 2440 ml     Physical Exam  Awake Alert, Oriented X 3, No new F.N deficits, Normal affect [place].AT,PERRAL Supple Neck,No JVD, No cervical lymphadenopathy appriciated.  Symmetrical Chest wall movement, Good air movement bilaterally, CTAB RRR,No Gallops,Rubs or new Murmurs, No Parasternal Heave +ve B.Sounds, Abd Soft, No tenderness, No organomegaly appriciated, No rebound - guarding or rigidity. No Cyanosis, Clubbing or edema, No new  Rash or bruise   No janeway, no osler, no splinter    Data Review:    CBC Recent Labs  Lab 04/04/18 0455 04/05/18 0503 04/06/18 0511 04/07/18 0517 04/08/18 0449  WBC 9.1 9.0 8.6 10.2 9.6  HGB 11.4* 11.1* 9.9* 10.2* 9.4*  HCT 33.4* 33.3* 29.0* 30.8* 28.5*  PLT 215 272 298 339 386  MCV 85.2 85.8 84.1 83.9 84.1  MCH 29.1 28.6 28.7 27.8 27.7  MCHC 34.1 33.3 34.1 33.1 33.0  RDW 13.7 14.1 14.1 14.0 14.2    Chemistries  Recent Labs  Lab 04/04/18 0455 04/05/18 0503 04/06/18 0511 04/07/18 0517 04/08/18 0449  NA 138 137 138 140 144  K 3.9 4.0 3.3* 3.6 3.3*  CL 105 105 107 108 111  CO2 _0 GLUCOSE 127* 166* 116* 139* 136*  BUN 5* 5* 5* 6 5*  CREATININE 0.64 0.52* 0.50* 0.55* 0.65  CALCIUM 8.5* 8.7* 8.9 8.8* 9.1  AST 25 52* 61* 38 26  ALT 26 43 62* 56* 41  ALKPHOS 94 175* 213* 219* 188*  BILITOT 1.2 1.5* 0.6 0.8 0.7   ------------------------------------------------------------------------------------------------------------------ No results for input(s): CHOL, HDL, LDLCALC, TRIG, CHOLHDL, LDLDIRECT in the last 72 hours.  Lab Results  Component Value Date   HGBA1C 13.9 (H) 04/01/2018   ------------------------------------------------------------------------------------------------------------------ No results for input(s): TSH, T4TOTAL, T3FREE, THYROIDAB in the last 72 hours.  Invalid input(s): FREET3 ------------------------------------------------------------------------------------------------------------------ No results for input(s): VITAMINB12, FOLATE, FERRITIN, TIBC, IRON, RETICCTPCT in the last 72 hours.  Coagulation profile No results for input(s): INR, PROTIME in the last 168 hours.  No results for input(s): DDIMER in the last 72 hours.  Cardiac Enzymes No results for input(s): CKMB, TROPONINI, MYOGLOBIN in the last 168 hours.  Invalid input(s):  CK ------------------------------------------------------------------------------------------------------------------ No results found for: BNP  Inpatient Medications  Scheduled Meds: . enoxaparin (LOVENOX) injection  40 mg Subcutaneous Q24H  . gemfibrozil  600 mg Oral BID AC  . insulin aspart  0-9 Units Subcutaneous Q4H  . insulin glargine  25 Units Subcutaneous Daily  . omega-3 acid ethyl esters  1 g Oral BID   Continuous Infusions: . sodium chloride 100 mL/hr at 04/08/18 0523  . cefTRIAXone (ROCEPHIN)  IV Stopped (04/07/18 2204)   PRN Meds:.acetaminophen **OR** acetaminophen, HYDROmorphone (DILAUDID) injection, ibuprofen, ondansetron (ZOFRAN) IV  Micro Results Recent Results (from the past 240 hour(s))  Culture, Urine     Status: None   Collection Time: 04/02/18  6:15 AM  Result Value Ref Range Status   Specimen Description   Final    URINE, SUPRAPUBIC Performed at [facility], [place] [address] [place], [place] [address]    Special Requests   Final    NONE Performed at [facility], [place] [address], [place], [place] [address]    Culture   Final    NO GROWTH Performed at [facility], [place] [address], [place], [place] [address]    Report Status 04/03/2018 FINAL  Final  Culture, blood (routine x 2)     Status: None (Preliminary result)   Collection Time: 04/03/18  7:50 AM  Result Value Ref Range Status   Specimen Description   Final    BLOOD RIGHT HAND Performed at [place] [address], [place], [place] [address]    Special Requests   Final    BOTTLES DRAWN AEROBIC ONLY Blood Culture adequate volume Performed at [place] [address] [place], [place] [address]    Culture   Final    NO GROWTH 4 DAYS Performed at [facility], [place] [address] [place], [place] [address]    Report Status PENDING  Incomplete  Culture, blood (routine x 2)     Status: None (Preliminary  result)   Collection Time: 04/03/18  7:53 AM  Result Value Ref Range Status   Specimen Description   Final    BLOOD RIGHT ARM Performed at [place] [address] [place], [place] [address]    Special Requests   Final    BOTTLES DRAWN AEROBIC AND ANAEROBIC Blood Culture adequate volume Performed at [place] [address] [place], [place] [address]    Culture   Final    NO GROWTH 4 DAYS Performed at [facility], [place] [address], [place], [place] [address]    Report Status PENDING  Incomplete    Radiology Reports Dg Chest 2 View  Result Date: 04/07/2018 CLINICAL DATA:  Mild fever. Recent history of hospital admission for abdominal pain EXAM: CHEST - 2 VIEW COMPARISON:  04/08/2011 FINDINGS: The heart size and mediastinal contours are within normal limits. Both lungs are clear. No pleural effusion or pneumothorax. The visualized skeletal structures are unremarkable. IMPRESSION: Normal chest radiographs. Electronically Signed   By: Lajean Manes M.D.   On: 04/07/2018 15:46   Ct Abdomen W Contrast  Result Date: 04/05/2018 CLINICAL DATA:  Pancreatitis EXAM: CT ABDOMEN WITH CONTRAST TECHNIQUE: Multidetector CT imaging of the abdomen was performed using the standard protocol following bolus administration of intravenous contrast. CONTRAST:  58m ISOVUE-300 IOPAMIDOL (ISOVUE-300) INJECTION 61% COMPARISON:  04/01/2018 FINDINGS: Lower chest: Tiny effusions bilaterally. Hepatobiliary: No focal abnormality within the liver parenchyma. Gallbladder wall thickening evident. No intrahepatic or extrahepatic biliary dilation. Pancreas: Extensive edema/inflammation around the head of pancreas with relatively well preserved peripancreatic fat in the region the pancreatic tail. No features to suggest pancreatic necrosis. No dilatation of the main pancreatic duct. Spleen: No splenomegaly. No focal mass lesion. Adrenals/Urinary Tract: No adrenal nodule or  mass. Kidneys unremarkable. Stomach/Bowel: Stomach is nondistended. No gastric wall thickening. No evidence of outlet obstruction. Duodenum tracks through the edema/inflammation in the anterior pararenal space and mild duodenal wall thickening likely secondary. No duodenal obstruction. No small bowel or colonic dilatation within the visualized abdomen. Vascular/Lymphatic: No abdominal aortic aneurysm. Portal vein and superior mesenteric vein are patent. Splenic vein is patent. No complication of the celiac axis or SMA. Other: Fairly extensive retroperitoneal edema around the head of the pancreas, tracking down on the right towards the pelvis. No rim enhancing or organized fluid collection to suggest evolving pseudocyst or abscess. Musculoskeletal: No worrisome lytic or sclerotic osseous abnormality. IMPRESSION: 1. Peripancreatic edema/inflammation with retroperitoneal fluid is more prominent than on the prior study. No organized collection to suggest pseudocyst or abscess. No findings to suggest pancreatic necrosis. 2. Tiny bilateral pleural effusions, new in the interval. 3. No vascular complication in the upper abdomen. Electronically Signed  By: Misty Stanley M.D.   On: 04/05/2018 14:57   Mr Lumbar Spine W Wo Contrast  Result Date: 04/07/2018 CLINICAL DATA:  Initial evaluation for fever of unknown origin. EXAM: MRI LUMBAR SPINE WITHOUT AND WITH CONTRAST TECHNIQUE: Multiplanar and multiecho pulse sequences of the lumbar spine were obtained without and with intravenous contrast. CONTRAST:  52m MULTIHANCE GADOBENATE DIMEGLUMINE 529 MG/ML IV SOLN COMPARISON:  Prior CT from 04/05/2018 FINDINGS: Segmentation: Normal segmentation. Lowest well-formed disc labeled the L5-S1 level. Alignment: Straightening of the normal lumbar lordosis. No listhesis. Vertebrae: Vertebral body heights maintained without evidence for acute or chronic fracture. Bone marrow signal intensity within normal limits. No discrete or worrisome  osseous lesions. No abnormal marrow edema. No findings to suggest osteomyelitis discitis. Conus medullaris and cauda equina: Conus extends to the L1 level. Conus and cauda equina appear normal. Paraspinal and other soft tissues: Mild edema seen involving the anterior margin of the right psoas muscle, likely related to the acute inflammatory changes seen about the pancreas on prior CT. Mild edema with enhancement present within the inter spinous soft tissues at L2-3 through L5-S1. Edema and enhancement within the adjacent posterior paraspinous musculature (series 4, image 5). Finding of uncertain significance, but suggestive of possible myositis. This could be either infectious or inflammatory in nature. Possible injury/strain could also be considered. No discrete collection or other abnormality. No evidence for septic arthritis involving the adjacent facets as a source. Disc levels: No significant disc pathology seen within the lumbar spine. No disc bulge or disc protrusion. No canal or foraminal stenosis. No impingement. Are well hydrated with preserved disc height. IMPRESSION: 1. Mild edema with enhancement involving the interspinous soft tissues at L2-3 through L5-S1, with extension into the adjacent lower paraspinous musculature. Findings of uncertain significance, but suggestive of possible myositis. This could be either infectious or inflammatory in nature. Possible muscular injury/strain could also be considered. No discrete collections identified. 2. Mild edema along the anterior margin of the right psoas muscle related to the previously identified acute inflammatory process about the pancreas. 3. Otherwise negative MRI of the lumbar spine. Electronically Signed   By: BJeannine BogaM.D.   On: 04/07/2018 20:38   Ct Abdomen Pelvis W Contrast  Result Date: 04/01/2018 CLINICAL DATA:  Initial evaluation for acute right upper quadrant pain, fever. EXAM: CT ABDOMEN AND PELVIS WITH CONTRAST TECHNIQUE:  Multidetector CT imaging of the abdomen and pelvis was performed using the standard protocol following bolus administration of intravenous contrast. CONTRAST:  1031mISOVUE-300 IOPAMIDOL (ISOVUE-300) INJECTION 61% COMPARISON:  None. FINDINGS: Lower chest: Visualized lungs are clear. Hepatobiliary: Liver demonstrates a normal contrast enhanced appearance. Gallbladder within normal limits. No biliary dilatation. Pancreas: Hazy inflammatory stranding about the head and uncinate process of the pancreas, suggesting acute pancreatitis. No loculated peripancreatic collection or other complication identified. Spleen: Unremarkable. Adrenals/Urinary Tract: Adrenal glands are normal. Kidneys equal in size with symmetric enhancement. No nephrolithiasis, hydronephrosis, or focal enhancing renal mass. No hydroureter. Bladder within normal limits. Stomach/Bowel: Stomach within normal limits. Inflammatory stranding surrounds the duodenum due to the adjacent inflammatory process within the pancreas. No evidence for bowel obstruction. Normal appendix. No other acute inflammatory changes seen about the bowels. Vascular/Lymphatic: Normal intravascular enhancement seen throughout the intra-abdominal aorta and its branch vessels. No adenopathy. Reproductive: Prostate normal. Other: No free air. Small volume free fluid within the upper abdomen related to the acute inflammatory changes about the pancreas. Musculoskeletal: No acute osseus abnormality. No worrisome lytic or blastic osseous lesions. IMPRESSION:  1. Findings consistent with acute pancreatitis. No complication identified. 2. No other acute abnormality within the abdomen and pelvis. Electronically Signed   By: Jeannine Boga M.D.   On: 04/01/2018 22:07   US Abdomen Limited Ruq  Result Date: 04/06/2018 CLINICAL DATA:  22 year old male with a history of pancreatitis EXAM: ULTRASOUND ABDOMEN LIMITED RIGHT UPPER QUADRANT COMPARISON:  None. FINDINGS: Gallbladder:  Heterogeneously a Co a teary a within the gallbladder lumen, with gallbladder wall thickening/striations, measuring 12 mm-13 mm on the ultrasound. Common bile duct measures 3-4 mm. Sonographic Murphy's sign is recorded as negative. Common bile duct: Diameter: 3 mm-4 mm. Liver: No focal lesion identified. Within normal limits in parenchymal echogenicity. Portal vein is patent on color Doppler imaging with normal direction of blood flow towards the liver. IMPRESSION: The ultrasound is equivocal for acute cholecystitis. The gallbladder lumen is filled with heterogeneous material and there is gallbladder wall thickening, however, sonographic Murphy's sign is recorded as negative. The findings may reflect either secondary inflammatory changes in the presence of a pre-existing pancreatitis (CT sequence shows that the pancreatitis preceded the gallbladder inflammation), or interval development of acute cholecystitis. If there is need for confirmatory testing, HIDA scan may be useful. Electronically Signed   By: Corrie Mckusick D.O.   On: 04/06/2018 15:11    Time Spent in minutes  30   Jani Gravel M.D on 04/08/2018 at 7:41 AM  Between 7am to 7pm - Pager - [phone]  After 7pm go to [email or web address] - password [facility]  [facility] -  Office  [phone]

## 2018-04-08 NOTE — Progress Notes (Signed)
[facility]  [facility] will follow pt with ID team to support home infusion needs if ordered at DC to home.  If patient discharges after hours, please call [phone].   Sedalia Mutaamela S Chandler 04/08/2018, 9:42 AM

## 2018-04-09 LAB — CBC
HCT: 30.1 % — ABNORMAL LOW (ref 39.0–52.0)
HEMOGLOBIN: 9.8 g/dL — AB (ref 13.0–17.0)
MCH: 27.5 pg (ref 26.0–34.0)
MCHC: 32.6 g/dL (ref 30.0–36.0)
MCV: 84.3 fL (ref 78.0–100.0)
PLATELETS: 517 10*3/uL — AB (ref 150–400)
RBC: 3.57 MIL/uL — AB (ref 4.22–5.81)
RDW: 14 % (ref 11.5–15.5)
WBC: 9.6 10*3/uL (ref 4.0–10.5)

## 2018-04-09 LAB — GLUCOSE, CAPILLARY
GLUCOSE-CAPILLARY: 83 mg/dL (ref 70–99)
GLUCOSE-CAPILLARY: 129 mg/dL — AB (ref 70–99)
Glucose-Capillary: 117 mg/dL — ABNORMAL HIGH (ref 70–99)

## 2018-04-09 LAB — COMPREHENSIVE METABOLIC PANEL
ALBUMIN: 2.7 g/dL — AB (ref 3.5–5.0)
ALT: 39 U/L (ref 0–44)
ANION GAP: 8 (ref 5–15)
AST: 27 U/L (ref 15–41)
Alkaline Phosphatase: 219 U/L — ABNORMAL HIGH (ref 38–126)
BUN: 6 mg/dL (ref 6–20)
CHLORIDE: 108 mmol/L (ref 98–111)
CO2: 27 mmol/L (ref 22–32)
Calcium: 9.3 mg/dL (ref 8.9–10.3)
Creatinine, Ser: 0.57 mg/dL — ABNORMAL LOW (ref 0.61–1.24)
GFR calc non Af Amer: >60 mL/min (ref >60)
GLUCOSE: 101 mg/dL — AB (ref 70–99)
Potassium: 3.4 mmol/L — ABNORMAL LOW (ref 3.5–5.1)
SODIUM: 143 mmol/L (ref 135–145)
Total Bilirubin: 0.6 mg/dL (ref 0.3–1.2)
Total Protein: 7.3 g/dL (ref 6.5–8.1)

## 2018-04-09 LAB — RPR: RPR Ser Ql: NONREACTIVE

## 2018-04-09 MED ORDER — INSULIN ASPART 100 UNIT/ML ~~LOC~~ SOLN: SUBCUTANEOUS | 11 refills | Status: AC

## 2018-04-09 NOTE — Discharge Summary (Signed)
Alfred Li, is a 22 y.o. male  DOB December 09, 1995  MRN [id number].  Admission date:  04/01/2018  Admitting Physician  Jani Gravel, MD  Discharge Date:  04/09/2018   Primary MD  Patient, No Pcp Per  Recommendations for primary care physician for things to follow:    Fever unknown origin  (ESR 112) Last febrile yesterday afternoon ? Pancreatitis vs UTI Pt became febrile 7/23 Urine culture 7/20 negative Blood culture 7/21 ngtd Repeat blood culture 7/24 ngtd CT abd/ pelvis 7/23=> negative for necrosis or pseudocyst CXR 7/25=> negative MRI lumbar spine => ? myositis Resumed rocephin 1gm iv qday 7/24=> 7/26 Cardiac echo 7/26=> negative F/u with pcp in 4 days for f/u of pancreatitis and fever  SIRS secondary to acute pancreatitis, pancreatitis secondary to elevated triglycerides -Continue supportive care, IV fluid, pain control  -CT A/P without gallstones. repeat CT abdomen7/23=> negative for pseudocyst Repeat CT abdomen 7/26=> negative for pseudocyst, shows inflammation  New diagnosis diabetes, uncontrolled with hyperglycemia Hemoglobin A1c 13.9 Cont Lantus 25 units [place] qday Cont SSI F/u with pcp in 4 days  Elevated LFT Likely due to pancreatitis Hepatitis panelnegative   Hyperlipidemia Cont fish oil Cont Gemfibrozil DC lipitor     Admission Diagnosis  Hyperglycemia [R73.9] Acute pancreatitis without infection or necrosis, unspecified pancreatitis type [K85.90]   Discharge Diagnosis  Hyperglycemia [R73.9] Acute pancreatitis without infection or necrosis, unspecified pancreatitis type [K85.90]    Principal Problem:   Pancreatitis Active Problems:   SIRS (systemic inflammatory response syndrome) (HCC)   High triglycerides   Diabetes (HCC)   LFT elevation   Fever      History reviewed. No pertinent past medical history.  History reviewed. No pertinent surgical  history.     HPI  from the history and physical done on the day of admission:     21 y.o.male,w family hx of diabetes, apparently presents with 4 days of epigastric pain. Pt denies fever, chills, cp, palp, sob, diarrhea, brbpr. Pt was seen at [facility] urgent care and tx with zantac earlier today but his abdominal pain worsened and therefore presented to ED for evaluation. Notes n/v x1 today. No bloody emesis.   In ED,   CT abd/ pelvis IMPRESSION: 1. Findings consistent with acute pancreatitis. No complication identified. 2. No other acute abnormality within the abdomen and pelvis.  Lipase 109  Glucose 339 Bun 6, Creatinine 0.78 Ast 31, Alt 78, Alk phos 145, T. Bili 0.6 Wbc 15.7, Hgb 15.7, Plt 279  Urinalysis wbc 21-50, rbc 0-5  Pt will be admitted for UTI, pancreatitis and new onset diabetes.      Hospital Course:     Pt was admitted for pancreatitis secondary to hypertriglyceridemia due to new onset Type 1 Dm.  Pt was tx with ns iv, and rocephin iv for uti,  When urine culture from 7/19 ngtd rocephin discontinued.  Pt developed fever (low grade) starting on 7/23 Blood culture x2 (7/21) ngtd Blood culture x2 (7/25) ngtd Cardiac echo 7/26=> negative  for vegetation Rocephin was restarted cause source of fever unclear.   ID consulted and recommended discontinuation of abx.  ESR 118 Lipase is currently normal Current thought is that his fever is secondary to pancreatitis Pt denies abd pain, n/v, diarrhea, dysuria, hematuria and requests discharged to home     Follow UP  Follow-up Information    Jani Gravel, MD Follow up in 4 day(s).   Specialty:  Internal Medicine Why:  Wednesday AM, pt can call [phone] for apppointment Contact information: [address] [place] [address] [phone]            Consults obtained - ID  Discharge Condition: stable  Diet and Activity recommendation: See Discharge Instructions  below  Discharge Instructions     Discharge Instructions    Ambulatory referral to Nutrition and Diabetic Education   Complete by:  As directed         Discharge Medications     Allergies as of 04/09/2018   No Known Allergies     Medication List    STOP taking these medications   amoxicillin 500 MG capsule Commonly known as:  AMOXIL   AYR SALINE NASAL NO-DRIP Gel   loperamide 2 MG capsule Commonly known as:  IMODIUM   mupirocin ointment 2 % Commonly known as:  BACTROBAN   oxymetazoline 0.05 % nasal spray Commonly known as:  AFRIN NASAL SPRAY   Pseudoephedrine-Guaifenesin [phone] MG Tb12   ranitidine 150 MG tablet Commonly known as:  ZANTAC     TAKE these medications   acetaminophen 500 MG tablet Commonly known as:  TYLENOL Take 1,000 mg by mouth every 6 (six) hours as needed for moderate pain.   gemfibrozil 600 MG tablet Commonly known as:  LOPID Take 1 tablet (600 mg total) by mouth 2 (two) times daily before a meal.   insulin aspart 100 UNIT/ML injection Commonly known as:  novoLOG Correction coverage: Sensitive (thin, NPO, renal) Please check sugar 4 times a day (before each meal and at bedtime) CBG < 70: implement hypoglycemia protocol CBG 70 - 120: 0 units CBG 121 - 150: 1 unit CBG 151 - 200: 2 units CBG 201 - 250: 3 units CBG 251 - 300: 5 units CBG 301 - 350: 7 units CBG 351 - 400: 9 units CBG > 400: call MD and obtain STAT lab verification   insulin glargine 100 UNIT/ML injection Commonly known as:  LANTUS Inject 0.25 mLs (25 Units total) into the skin daily.   omega-3 acid ethyl esters 1 g capsule Commonly known as:  LOVAZA Take 1 capsule (1 g total) by mouth 2 (two) times daily.   ondansetron 4 MG tablet Commonly known as:  ZOFRAN Take 4 mg by mouth every 8 (eight) hours as needed for nausea or vomiting.       Major procedures and Radiology Reports - PLEASE review detailed and final reports for all details, in brief -        Dg Chest 2 View  Result Date: 04/07/2018 CLINICAL DATA:  Mild fever. Recent history of hospital admission for abdominal pain EXAM: CHEST - 2 VIEW COMPARISON:  04/08/2011 FINDINGS: The heart size and mediastinal contours are within normal limits. Both lungs are clear. No pleural effusion or pneumothorax. The visualized skeletal structures are unremarkable. IMPRESSION: Normal chest radiographs. Electronically Signed   By: Lajean Manes M.D.   On: 04/07/2018 15:46   Ct Abdomen W Contrast  Result Date: 04/08/2018 CLINICAL DATA:  Followup pancreatitis. EXAM: CT  ABDOMEN WITH CONTRAST TECHNIQUE: Multidetector CT imaging of the abdomen was performed using the standard protocol following bolus administration of intravenous contrast. CONTRAST:  125m ISOVUE-300 IOPAMIDOL (ISOVUE-300) INJECTION 61%, 329mISOVUE-300 IOPAMIDOL (ISOVUE-300) INJECTION 61% COMPARISON:  CT scan 04/01/2018 FINDINGS: Lower chest: Small bilateral pleural effusions, right greater than left with minimal overlying atelectasis. The heart is normal in size. Hepatobiliary: No focal hepatic lesions or intrahepatic biliary dilatation. The portal and hepatic veins are patent. The gallbladder is contracted and surrounded by fluid. No common bile duct dilatation. Pancreas: Persistent slightly enlarged pancreatic head with surrounding severe inflammatory phlegmon most notably involving the duodenum. There is moderate fluid in the anterior pararenal space on the right but I do not see a discrete organized/drainable abscess or organizing pseudocyst yet. This extends down along the retroperitoneum and right psoas muscle and also in the right pericolic gutter. Normal enhancement of the pancreas without findings for pancreatic necrosis. No pancreatic ductal dilatation. Spleen: Normal size.  No focal lesions. Adrenals/Urinary Tract: The adrenal glands and kidneys are unremarkable and stable. Stomach/Bowel: The stomach is unremarkable. There is moderate  inflammation in the antropyloric region and fairly significant inflammation involving the second and third portions of the duodenum. There is also moderate inflammation of the right colon. No findings for small bowel obstruction or free air. Vascular/Lymphatic: The aorta and branch vessels are patent. The major venous structures are patent. Other: None Musculoskeletal: No significant bony findings. IMPRESSION: 1. Progressive and fairly extensive inflammatory phlegmon surrounding the pancreatic head, duodenum and right colon. I do not see a discrete drainable/organized abscess, pseudocyst or hematoma. 2. No findings to suggest pancreatic necrosis. No common bile duct or pancreatic duct dilatation. 3. Small bilateral pleural effusions. Electronically Signed   By: P.Marijo Sanes.D.   On: 04/08/2018 18:11   Ct Abdomen W Contrast  Result Date: 04/05/2018 CLINICAL DATA:  Pancreatitis EXAM: CT ABDOMEN WITH CONTRAST TECHNIQUE: Multidetector CT imaging of the abdomen was performed using the standard protocol following bolus administration of intravenous contrast. CONTRAST:  7541mSOVUE-300 IOPAMIDOL (ISOVUE-300) INJECTION 61% COMPARISON:  04/01/2018 FINDINGS: Lower chest: Tiny effusions bilaterally. Hepatobiliary: No focal abnormality within the liver parenchyma. Gallbladder wall thickening evident. No intrahepatic or extrahepatic biliary dilation. Pancreas: Extensive edema/inflammation around the head of pancreas with relatively well preserved peripancreatic fat in the region the pancreatic tail. No features to suggest pancreatic necrosis. No dilatation of the main pancreatic duct. Spleen: No splenomegaly. No focal mass lesion. Adrenals/Urinary Tract: No adrenal nodule or mass. Kidneys unremarkable. Stomach/Bowel: Stomach is nondistended. No gastric wall thickening. No evidence of outlet obstruction. Duodenum tracks through the edema/inflammation in the anterior pararenal space and mild duodenal wall thickening likely  secondary. No duodenal obstruction. No small bowel or colonic dilatation within the visualized abdomen. Vascular/Lymphatic: No abdominal aortic aneurysm. Portal vein and superior mesenteric vein are patent. Splenic vein is patent. No complication of the celiac axis or SMA. Other: Fairly extensive retroperitoneal edema around the head of the pancreas, tracking down on the right towards the pelvis. No rim enhancing or organized fluid collection to suggest evolving pseudocyst or abscess. Musculoskeletal: No worrisome lytic or sclerotic osseous abnormality. IMPRESSION: 1. Peripancreatic edema/inflammation with retroperitoneal fluid is more prominent than on the prior study. No organized collection to suggest pseudocyst or abscess. No findings to suggest pancreatic necrosis. 2. Tiny bilateral pleural effusions, new in the interval. 3. No vascular complication in the upper abdomen. Electronically Signed   By: EriMisty StanleyD.   On: 04/05/2018 14:57  Mr Lumbar Spine W Wo Contrast  Result Date: 04/07/2018 CLINICAL DATA:  Initial evaluation for fever of unknown origin. EXAM: MRI LUMBAR SPINE WITHOUT AND WITH CONTRAST TECHNIQUE: Multiplanar and multiecho pulse sequences of the lumbar spine were obtained without and with intravenous contrast. CONTRAST:  88m MULTIHANCE GADOBENATE DIMEGLUMINE 529 MG/ML IV SOLN COMPARISON:  Prior CT from 04/05/2018 FINDINGS: Segmentation: Normal segmentation. Lowest well-formed disc labeled the L5-S1 level. Alignment: Straightening of the normal lumbar lordosis. No listhesis. Vertebrae: Vertebral body heights maintained without evidence for acute or chronic fracture. Bone marrow signal intensity within normal limits. No discrete or worrisome osseous lesions. No abnormal marrow edema. No findings to suggest osteomyelitis discitis. Conus medullaris and cauda equina: Conus extends to the L1 level. Conus and cauda equina appear normal. Paraspinal and other soft tissues: Mild edema seen  involving the anterior margin of the right psoas muscle, likely related to the acute inflammatory changes seen about the pancreas on prior CT. Mild edema with enhancement present within the inter spinous soft tissues at L2-3 through L5-S1. Edema and enhancement within the adjacent posterior paraspinous musculature (series 4, image 5). Finding of uncertain significance, but suggestive of possible myositis. This could be either infectious or inflammatory in nature. Possible injury/strain could also be considered. No discrete collection or other abnormality. No evidence for septic arthritis involving the adjacent facets as a source. Disc levels: No significant disc pathology seen within the lumbar spine. No disc bulge or disc protrusion. No canal or foraminal stenosis. No impingement. Are well hydrated with preserved disc height. IMPRESSION: 1. Mild edema with enhancement involving the interspinous soft tissues at L2-3 through L5-S1, with extension into the adjacent lower paraspinous musculature. Findings of uncertain significance, but suggestive of possible myositis. This could be either infectious or inflammatory in nature. Possible muscular injury/strain could also be considered. No discrete collections identified. 2. Mild edema along the anterior margin of the right psoas muscle related to the previously identified acute inflammatory process about the pancreas. 3. Otherwise negative MRI of the lumbar spine. Electronically Signed   By: BJeannine BogaM.D.   On: 04/07/2018 20:38   Ct Abdomen Pelvis W Contrast  Result Date: 04/01/2018 CLINICAL DATA:  Initial evaluation for acute right upper quadrant pain, fever. EXAM: CT ABDOMEN AND PELVIS WITH CONTRAST TECHNIQUE: Multidetector CT imaging of the abdomen and pelvis was performed using the standard protocol following bolus administration of intravenous contrast. CONTRAST:  1073mISOVUE-300 IOPAMIDOL (ISOVUE-300) INJECTION 61% COMPARISON:  None. FINDINGS: Lower  chest: Visualized lungs are clear. Hepatobiliary: Liver demonstrates a normal contrast enhanced appearance. Gallbladder within normal limits. No biliary dilatation. Pancreas: Hazy inflammatory stranding about the head and uncinate process of the pancreas, suggesting acute pancreatitis. No loculated peripancreatic collection or other complication identified. Spleen: Unremarkable. Adrenals/Urinary Tract: Adrenal glands are normal. Kidneys equal in size with symmetric enhancement. No nephrolithiasis, hydronephrosis, or focal enhancing renal mass. No hydroureter. Bladder within normal limits. Stomach/Bowel: Stomach within normal limits. Inflammatory stranding surrounds the duodenum due to the adjacent inflammatory process within the pancreas. No evidence for bowel obstruction. Normal appendix. No other acute inflammatory changes seen about the bowels. Vascular/Lymphatic: Normal intravascular enhancement seen throughout the intra-abdominal aorta and its branch vessels. No adenopathy. Reproductive: Prostate normal. Other: No free air. Small volume free fluid within the upper abdomen related to the acute inflammatory changes about the pancreas. Musculoskeletal: No acute osseus abnormality. No worrisome lytic or blastic osseous lesions. IMPRESSION: 1. Findings consistent with acute pancreatitis. No complication identified. 2. No other  acute abnormality within the abdomen and pelvis. Electronically Signed   By: Jeannine Boga M.D.   On: 04/01/2018 22:07   US Abdomen Limited Ruq  Result Date: 04/06/2018 CLINICAL DATA:  22 year old male with a history of pancreatitis EXAM: ULTRASOUND ABDOMEN LIMITED RIGHT UPPER QUADRANT COMPARISON:  None. FINDINGS: Gallbladder: Heterogeneously a Co a teary a within the gallbladder lumen, with gallbladder wall thickening/striations, measuring 12 mm-13 mm on the ultrasound. Common bile duct measures 3-4 mm. Sonographic Murphy's sign is recorded as negative. Common bile duct: Diameter:  3 mm-4 mm. Liver: No focal lesion identified. Within normal limits in parenchymal echogenicity. Portal vein is patent on color Doppler imaging with normal direction of blood flow towards the liver. IMPRESSION: The ultrasound is equivocal for acute cholecystitis. The gallbladder lumen is filled with heterogeneous material and there is gallbladder wall thickening, however, sonographic Murphy's sign is recorded as negative. The findings may reflect either secondary inflammatory changes in the presence of a pre-existing pancreatitis (CT sequence shows that the pancreatitis preceded the gallbladder inflammation), or interval development of acute cholecystitis. If there is need for confirmatory testing, HIDA scan may be useful. Electronically Signed   By: Corrie Mckusick D.O.   On: 04/06/2018 15:11    Micro Results      Recent Results (from the past 240 hour(s))  Culture, Urine     Status: None   Collection Time: 04/02/18  6:15 AM  Result Value Ref Range Status   Specimen Description   Final    URINE, SUPRAPUBIC Performed at [facility] [address] [place], [place] [address]    Special Requests   Final    NONE Performed at [facility], [place] [address] [place], [place] [address]    Culture   Final    NO GROWTH Performed at [facility], [place] [address], [place], [place] [address]    Report Status 04/03/2018 FINAL  Final  Culture, blood (routine x 2)     Status: None   Collection Time: 04/03/18  7:50 AM  Result Value Ref Range Status   Specimen Description   Final    BLOOD RIGHT HAND Performed at [facility] [address], [place], [place] [address]    Special Requests   Final    BOTTLES DRAWN AEROBIC ONLY Blood Culture adequate volume Performed at [facility] [address], [place], [place] [address]    Culture   Final    NO GROWTH 5 DAYS Performed at [facility], [place] [address],  [place], [place] [address]    Report Status 04/08/2018 FINAL  Final  Culture, blood (routine x 2)     Status: None   Collection Time: 04/03/18  7:53 AM  Result Value Ref Range Status   Specimen Description   Final    BLOOD RIGHT ARM Performed at [facility] [address], [place], [place] [address]    Special Requests   Final    BOTTLES DRAWN AEROBIC AND ANAEROBIC Blood Culture adequate volume Performed at [facility] [address] [place], [place] [address]    Culture   Final    NO GROWTH 5 DAYS Performed at [facility], [place] [address] [place],  [address]    Report Status 04/08/2018 FINAL  Final  Culture, blood (routine x 2)     Status: None (Preliminary result)   Collection Time: 04/07/18  1:41 PM  Result  Value Ref Range Status   Specimen Description   Final    BLOOD LEFT ANTECUBITAL Performed at [facility] [address] [place], [place] [address]    Special Requests   Final    BOTTLES DRAWN AEROBIC AND ANAEROBIC Blood Culture adequate volume Performed at [facility] [address], [place], [place] [address]    Culture   Final    NO GROWTH 2 DAYS Performed at [facility] [address] [place], [place] [address]    Report Status PENDING  Incomplete  Culture, blood (routine x 2)     Status: None (Preliminary result)   Collection Time: 04/07/18  1:46 PM  Result Value Ref Range Status   Specimen Description   Final    BLOOD LEFT FOREARM Performed at [facility] [address], [place], [place] [address]    Special Requests   Final    BOTTLES DRAWN AEROBIC AND ANAEROBIC Blood Culture adequate volume Performed at [facility] [address], [place], [place] [address]    Culture   Final    NO GROWTH 2 DAYS Performed at [facility] [address], [place],  [address]    Report Status PENDING  Incomplete        Today   Subjective    Alfred Li today states no abdominal pain. No n/v, no diarrhea.  No fever today.     pt has no headache,no chest pain,no new weakness tingling or numbness, feels much better wants to go home today.    Objective   Blood pressure 113/75, pulse 91, temperature 98.2 F (36.8 C), resp. rate 18, height '5\' 10"'  (1.778 m), weight 68.4 kg (150 lb 11.2 oz), SpO2 100 %.   Intake/Output Summary (Last 24 hours) at 04/09/2018 1410 Last data filed at 04/09/2018 1010 Gross per 24 hour  Intake 2640 ml  Output -  Net 2640 ml    Exam Awake Alert, Oriented x 3, No new F.N deficits, Normal affect .AT,PERRAL Supple Neck,No JVD, No cervical lymphadenopathy appriciated.  Symmetrical Chest wall movement, Good air movement bilaterally, CTAB RRR,No Gallops,Rubs or new Murmurs, No Parasternal Heave +ve B.Sounds, Abd Soft, Non tender, No organomegaly appriciated, No rebound -guarding or rigidity. No Cyanosis, Clubbing or edema, No new Rash or bruise   Data Review   CBC w Diff:  Lab Results  Component Value Date   WBC 9.6 04/09/2018   HGB 9.8 (L) 04/09/2018   HCT 30.1 (L) 04/09/2018   PLT 517 (H) 04/09/2018   LYMPHOPCT 31 04/08/2011   MONOPCT 9 04/08/2011   EOSPCT 3 04/08/2011   BASOPCT 0 04/08/2011    CMP:  Lab Results  Component Value Date   NA 143 04/09/2018   K 3.4 (L) 04/09/2018   CL 108 04/09/2018   CO2 27 04/09/2018   BUN 6 04/09/2018   CREATININE 0.57 (L) 04/09/2018   PROT 7.3 04/09/2018   ALBUMIN 2.7 (L) 04/09/2018   BILITOT 0.6 04/09/2018   ALKPHOS 219 (H) 04/09/2018   AST 27 04/09/2018   ALT 39 04/09/2018  .   Total Time in preparing paper work, data evaluation and todays exam - 43 minutes  Jani Gravel M.D on 04/09/2018 at 2:10 PM  [facility]   Office  [phone]

## 2018-04-10 LAB — HIV-1 RNA QUANT-NO REFLEX-BLD
HIV 1 RNA Quant: <20 copies/mL
LOG10 HIV-1 RNA: UNDETERMINED {Log_copies}/mL

## 2018-04-11 LAB — GC/CHLAMYDIA PROBE AMP (~~LOC~~) NOT AT ARMC
CHLAMYDIA, DNA PROBE: POSITIVE — AB
NEISSERIA GONORRHEA: NEGATIVE

## 2018-04-12 LAB — CULTURE, BLOOD (ROUTINE X 2)
CULTURE: NO GROWTH
Culture: NO GROWTH
Special Requests: ADEQUATE
Special Requests: ADEQUATE

## 2018-04-13 DIAGNOSIS — E7800 Pure hypercholesterolemia, unspecified: Secondary | ICD-10-CM | POA: Diagnosis not present

## 2018-04-13 DIAGNOSIS — K859 Acute pancreatitis without necrosis or infection, unspecified: Secondary | ICD-10-CM | POA: Diagnosis not present

## 2018-04-13 DIAGNOSIS — E119 Type 2 diabetes mellitus without complications: Secondary | ICD-10-CM | POA: Diagnosis not present

## 2018-04-18 DIAGNOSIS — E781 Pure hyperglyceridemia: Secondary | ICD-10-CM | POA: Diagnosis not present

## 2018-04-18 DIAGNOSIS — Z6820 Body mass index (BMI) 20.0-20.9, adult: Secondary | ICD-10-CM | POA: Diagnosis not present

## 2018-04-18 DIAGNOSIS — E109 Type 1 diabetes mellitus without complications: Secondary | ICD-10-CM | POA: Diagnosis not present

## 2018-04-22 DIAGNOSIS — E119 Type 2 diabetes mellitus without complications: Secondary | ICD-10-CM | POA: Diagnosis not present

## 2018-04-22 DIAGNOSIS — H5213 Myopia, bilateral: Secondary | ICD-10-CM | POA: Diagnosis not present

## 2018-05-05 DIAGNOSIS — E781 Pure hyperglyceridemia: Secondary | ICD-10-CM | POA: Diagnosis not present

## 2018-06-27 DIAGNOSIS — H6122 Impacted cerumen, left ear: Secondary | ICD-10-CM | POA: Diagnosis not present

## 2019-06-20 ENCOUNTER — Emergency Department (HOSPITAL_COMMUNITY): Payer: 59

## 2019-06-20 ENCOUNTER — Other Ambulatory Visit: Payer: Self-pay

## 2019-06-20 ENCOUNTER — Encounter (HOSPITAL_COMMUNITY): Payer: Self-pay

## 2019-06-20 ENCOUNTER — Emergency Department (HOSPITAL_COMMUNITY)
Admission: EM | Admit: 2019-06-20 | Discharge: 2019-06-20 | Disposition: A | Discharge status: Home / Self Care | Payer: 59 | Attending: Emergency Medicine | Admitting: Emergency Medicine

## 2019-06-20 DIAGNOSIS — Z79899 Other long term (current) drug therapy: Secondary | ICD-10-CM | POA: Insufficient documentation

## 2019-06-20 DIAGNOSIS — K863 Pseudocyst of pancreas: Secondary | ICD-10-CM | POA: Diagnosis not present

## 2019-06-20 DIAGNOSIS — E119 Type 2 diabetes mellitus without complications: Secondary | ICD-10-CM | POA: Insufficient documentation

## 2019-06-20 DIAGNOSIS — R1010 Upper abdominal pain, unspecified: Secondary | ICD-10-CM | POA: Diagnosis not present

## 2019-06-20 DIAGNOSIS — Z794 Long term (current) use of insulin: Secondary | ICD-10-CM | POA: Diagnosis not present

## 2019-06-20 DIAGNOSIS — R109 Unspecified abdominal pain: Secondary | ICD-10-CM | POA: Diagnosis present

## 2019-06-20 HISTORY — DX: Type 2 diabetes mellitus without complications: E11.9

## 2019-06-20 LAB — COMPREHENSIVE METABOLIC PANEL
ALT: 50 U/L — ABNORMAL HIGH (ref 0–44)
AST: 34 U/L (ref 15–41)
Albumin: 3.8 g/dL (ref 3.5–5.0)
Alkaline Phosphatase: 158 U/L — ABNORMAL HIGH (ref 38–126)
Anion gap: 13 (ref 5–15)
BUN: 12 mg/dL (ref 6–20)
CO2: 25 mmol/L (ref 22–32)
Calcium: 9.8 mg/dL (ref 8.9–10.3)
Chloride: 97 mmol/L — ABNORMAL LOW (ref 98–111)
Creatinine, Ser: 0.81 mg/dL (ref 0.61–1.24)
GFR calc Af Amer: >60 mL/min (ref >60)
GFR calc non Af Amer: >60 mL/min (ref >60)
Glucose, Bld: 306 mg/dL — ABNORMAL HIGH (ref 70–99)
Potassium: 3.8 mmol/L (ref 3.5–5.1)
Sodium: 135 mmol/L (ref 135–145)
Total Bilirubin: 0.6 mg/dL (ref 0.3–1.2)
Total Protein: 8.4 g/dL — ABNORMAL HIGH (ref 6.5–8.1)

## 2019-06-20 LAB — CBC
HCT: 40.7 % (ref 39.0–52.0)
Hemoglobin: 12.9 g/dL — ABNORMAL LOW (ref 13.0–17.0)
MCH: 27.6 pg (ref 26.0–34.0)
MCHC: 31.7 g/dL (ref 30.0–36.0)
MCV: 87.2 fL (ref 80.0–100.0)
Platelets: 328 10*3/uL (ref 150–400)
RBC: 4.67 MIL/uL (ref 4.22–5.81)
RDW: 13.2 % (ref 11.5–15.5)
WBC: 9.8 10*3/uL (ref 4.0–10.5)
nRBC: 0 % (ref 0.0–0.2)

## 2019-06-20 LAB — LIPASE, BLOOD: Lipase: 35 U/L (ref 11–51)

## 2019-06-20 MED ORDER — HYDROMORPHONE HCL 1 MG/ML IJ SOLN
0.5000 mg | Freq: Once | INTRAMUSCULAR | Status: AC
  Administered 2019-06-20: 14:00:00 0.5 mg via INTRAVENOUS
  Filled 2019-06-20: qty 1

## 2019-06-20 MED ORDER — IOHEXOL 300 MG/ML  SOLN
100.0000 mL | Freq: Once | INTRAMUSCULAR | Status: AC | PRN
  Administered 2019-06-20: 100 mL via INTRAVENOUS

## 2019-06-20 MED ORDER — FAMOTIDINE IN NACL 20-0.9 MG/50ML-% IV SOLN
20.0000 mg | Freq: Once | INTRAVENOUS | Status: AC
  Administered 2019-06-20: 14:00:00 20 mg via INTRAVENOUS
  Filled 2019-06-20: qty 50

## 2019-06-20 MED ORDER — HYDROMORPHONE HCL 1 MG/ML IJ SOLN
1.0000 mg | Freq: Once | INTRAMUSCULAR | Status: AC
  Administered 2019-06-20: 1 mg via INTRAVENOUS
  Filled 2019-06-20: qty 1

## 2019-06-20 MED ORDER — HYDROCODONE-ACETAMINOPHEN 5-325 MG PO TABS: 1.0000 | ORAL_TABLET | ORAL | 0 refills | Status: AC | PRN

## 2019-06-20 MED ORDER — METOCLOPRAMIDE HCL 10 MG PO TABS: 10.0000 mg | ORAL_TABLET | Freq: Four times a day (QID) | ORAL | 0 refills | Status: AC | PRN

## 2019-06-20 MED ORDER — SODIUM CHLORIDE (PF) 0.9 % IJ SOLN
INTRAMUSCULAR | Status: AC
  Administered 2019-06-20: 16:00:00
  Filled 2019-06-20: qty 50

## 2019-06-20 MED ORDER — ONDANSETRON HCL 4 MG/2ML IJ SOLN
4.0000 mg | Freq: Once | INTRAMUSCULAR | Status: AC
  Administered 2019-06-20: 14:00:00 4 mg via INTRAVENOUS
  Filled 2019-06-20: qty 2

## 2019-06-20 MED ORDER — SODIUM CHLORIDE 0.9% FLUSH
3.0000 mL | Freq: Once | INTRAVENOUS | Status: AC
  Administered 2019-06-20: 3 mL via INTRAVENOUS

## 2019-06-20 MED ORDER — LACTATED RINGERS IV BOLUS
1000.0000 mL | Freq: Once | INTRAVENOUS | Status: AC
  Administered 2019-06-20: 1000 mL via INTRAVENOUS

## 2019-06-20 NOTE — ED Notes (Signed)
Patient transported to CT 

## 2019-06-20 NOTE — ED Triage Notes (Signed)
Patient c/o LUQ pain x 5 days. Patient states, "I was taking Tylenol and that helped a lot, but when I stopped taking Tylenol it hurt worse. Patient states, "When I burp it hurts worse." Patient denies any N/v/D.

## 2019-06-20 NOTE — Discharge Instructions (Addendum)
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 You need to see GI doctor in a week for follow up   Return to ER if you have worse abdominal pain, vomiting, fever

## 2019-06-20 NOTE — ED Provider Notes (Signed)
[facility]-EMERGENCY DEPT Provider Note   CSN: [id number] Arrival date & time: 06/20/19  1003     History   Chief Complaint Chief Complaint  Patient presents with  . Abdominal Pain    HPI Alfred Li is a 23 y.o. male.     HPI   23 year old male with abdominal pain.  Pain is in the epigastrium to left upper quadrant.  Onset about 5 days ago.  Persistent since then.  Waxes and wanes.  Worse with certain movements and when burping.  Some nausea.  No vomiting.  No change in his bowel movements.  No urinary complaints.  Reports decreased appetite.  Reports that his sugars have been somewhat labile recently.  He reports compliance with his medications.  He states that current symptoms are reminiscent of pancreatitis he has had previously.  Past Medical History:  Diagnosis Date  . Diabetes mellitus without complication [facility])     Patient Active Problem List   Diagnosis Date Noted  . Fever   . SIRS (systemic inflammatory response syndrome) (HCC) 04/02/2018  . High triglycerides 04/02/2018  . Diabetes (HCC) 04/02/2018  . LFT elevation 04/02/2018  . Pancreatitis 04/01/2018    History reviewed. No pertinent surgical history.      Home Medications    Prior to Admission medications   Medication Sig Start Date End Date Taking? Authorizing Provider  insulin aspart (NOVOLOG) 100 UNIT/ML injection Correction coverage: Sensitive (thin, NPO, renal) Please check sugar 4 times a day (before each meal and at bedtime) CBG < 70: implement hypoglycemia protocol CBG 70 - 120: 0 units CBG 121 - 150: 1 unit CBG 151 - 200: 2 units CBG 201 - 250: 3 units CBG 251 - 300: 5 units CBG 301 - 350: 7 units CBG 351 - 400: 9 units CBG > 400: call MD and obtain STAT lab verification 04/09/18  Yes Pearson Grippe, MD  insulin glargine (LANTUS) 100 UNIT/ML injection Inject 0.25 mLs (25 Units total) into the skin daily. 04/08/18  Yes Pearson Grippe, MD  omega-3 acid ethyl esters  (LOVAZA) 1 g capsule Take 1 capsule (1 g total) by mouth 2 (two) times daily. Patient taking differently: Take 1 g by mouth daily.  04/07/18  Yes Pearson Grippe, MD  gemfibrozil (LOPID) 600 MG tablet Take 1 tablet (600 mg total) by mouth 2 (two) times daily before a meal. Patient not taking: Reported on 06/20/2019 04/07/18   Pearson Grippe, MD    Family History Family History  Problem Relation Age of Onset  . Hypertension Mother   . Diabetes Mother     Social History Social History   Tobacco Use  . Smoking status: Never Smoker  . Smokeless tobacco: Never Used  Substance Use Topics  . Alcohol use: No  . Drug use: No     Allergies   Patient has no known allergies.   Review of Systems Review of Systems  All systems reviewed and negative, other than as noted in HPI.  Physical Exam Updated Vital Signs BP 122/82 (BP Location: Left Arm)   Pulse 90   Temp 99.3 F (37.4 C) (Oral)   Resp 18   Ht 5\' 9"  (1.753 m)   Wt 63.5 kg   SpO2 95%   BMI 20.67 kg/m   Physical Exam Vitals signs and nursing note reviewed.  Constitutional:      General: He is not in acute distress.    Appearance: He is well-developed.  HENT:  Head: Normocephalic and atraumatic.  Eyes:     General:        Right eye: No discharge.        Left eye: No discharge.     Conjunctiva/sclera: Conjunctivae normal.  Neck:     Musculoskeletal: Neck supple.  Cardiovascular:     Rate and Rhythm: Normal rate and regular rhythm.     Heart sounds: Normal heart sounds. No murmur. No friction rub. No gallop.   Pulmonary:     Effort: Pulmonary effort is normal. No respiratory distress.     Breath sounds: Normal breath sounds.  Abdominal:     General: There is no distension.     Palpations: Abdomen is soft.     Tenderness: There is abdominal tenderness.     Comments: Mild tenderness in the epigastrium and left lower quadrant.  No rebound or guarding.  Musculoskeletal:        General: No tenderness.  Skin:     General: Skin is warm and dry.  Neurological:     Mental Status: He is alert.  Psychiatric:        Behavior: Behavior normal.        Thought Content: Thought content normal.      ED Treatments / Results  Labs (all labs ordered are listed, but only abnormal results are displayed) Labs Reviewed  COMPREHENSIVE METABOLIC PANEL - Abnormal; Notable for the following components:      Result Value   Chloride 97 (*)    Glucose, Bld 306 (*)    Total Protein 8.4 (*)    ALT 50 (*)    Alkaline Phosphatase 158 (*)    All other components within normal limits  CBC - Abnormal; Notable for the following components:   Hemoglobin 12.9 (*)    All other components within normal limits  LIPASE, BLOOD  URINALYSIS, ROUTINE W REFLEX MICROSCOPIC    EKG None  Radiology No results found.  Procedures Procedures (including critical care time)  Medications Ordered in ED Medications  sodium chloride flush (NS) 0.9 % injection 3 mL (3 mLs Intravenous Given 06/20/19 1332)  lactated ringers bolus 1,000 mL (1,000 mLs Intravenous New Bag/Given 06/20/19 1406)  ondansetron (ZOFRAN) injection 4 mg (4 mg Intravenous Given 06/20/19 1406)  famotidine (PEPCID) IVPB 20 mg premix (20 mg Intravenous New Bag/Given 06/20/19 1407)  HYDROmorphone (DILAUDID) injection 0.5 mg (0.5 mg Intravenous Given 06/20/19 1406)     Initial Impression / Assessment and Plan / ED Course  I have reviewed the triage vital signs and the nursing notes.  Pertinent labs & imaging results that were available during my care of the patient were reviewed by me and considered in my medical decision making (see chart for details).       23 year old male with upper abdominal pain.  Lipase is normal.  He is hyperglycemic, but no DKA.  He was treated with IV fluids, pain medicines, insulin and Pepcid.  I suspect that this may be gastritis or PUD.  Will CT to further evaluate though.  Care signed out to Dr. Darl Householder with imaging pending.  Final  Clinical Impressions(s) / ED Diagnoses   Final diagnoses:  Upper abdominal pain    ED Discharge Orders    None       Virgel Manifold, MD 06/22/19 1041

## 2019-06-20 NOTE — ED Provider Notes (Signed)
  Physical Exam  BP 112/70   Pulse 69   Temp 99.3 F (37.4 C) (Oral)   Resp 18   Ht 5\' 9"  (1.753 m)   Wt 63.5 kg   SpO2 98%   BMI 20.67 kg/m   Physical Exam  ED Course/Procedures     Procedures  MDM  Patient care assumed at 3 pm. Patient has been having epigastric and LUQ pain. Had recent pancreatitis. Lipase normal, sign out pending CT ab/pel.   5:39 PM CT showed pancreatitis with pseudocyst. Since lipase is normal and pain is controlled, stable for discharge. Will dc home with reglan, pain meds.    Drenda Freeze, MD 06/20/19 [phone]

## 2019-06-20 NOTE — ED Notes (Signed)
[phone], please call mother, [place] with updates

## 2020-03-20 IMAGING — CT CT ABD-PELV W/ CM
2 of 4 series · 16 of 46 positions shown, 18 images · IV contrast (omnipaque)
Comparison: April 08, 2018

CLINICAL DATA: Left upper quadrant pain for 5 days

EXAM:
CT ABDOMEN AND PELVIS WITH CONTRAST
TECHNIQUE: Multidetector CT imaging of the abdomen and pelvis was performed
using the standard protocol following bolus administration of
intravenous contrast.
CONTRAST:  100mL OMNIPAQUE IOHEXOL 300 MG/ML  SOLN

[Series 2: axial st · axial · 0.70mm/px · z∈[-363,+87]mm · 13 of 101 slices shown, 15 images]
[im 6/101  soft-tissue]
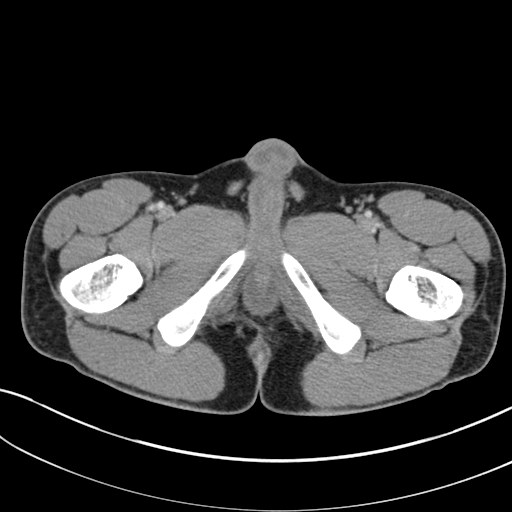
[im 6/101  bone]
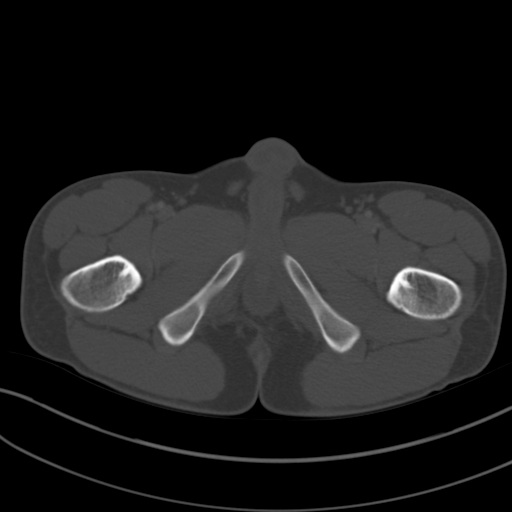
[im 16/101  soft-tissue]
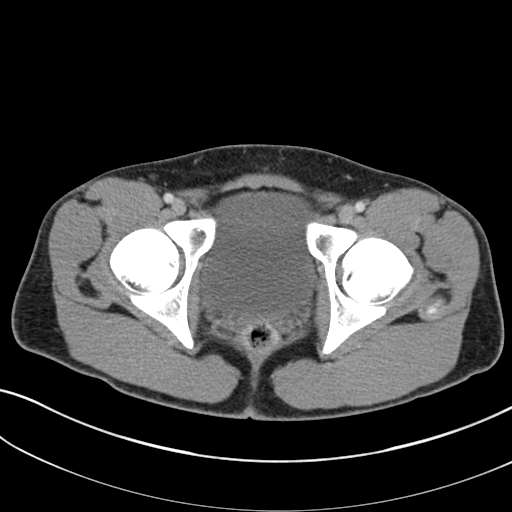
[im 21/101  soft-tissue]
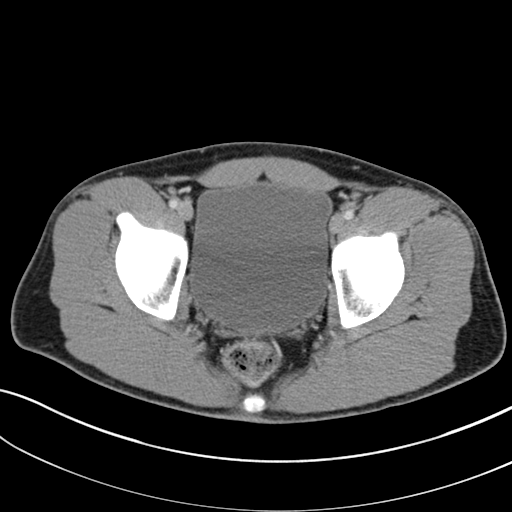
[im 31/101  soft-tissue]
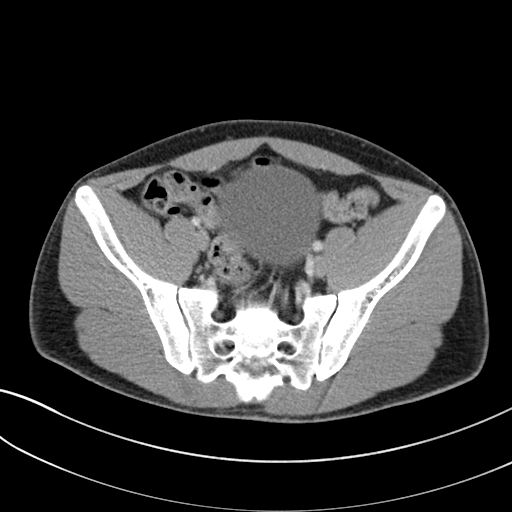
[im 36/101  soft-tissue]
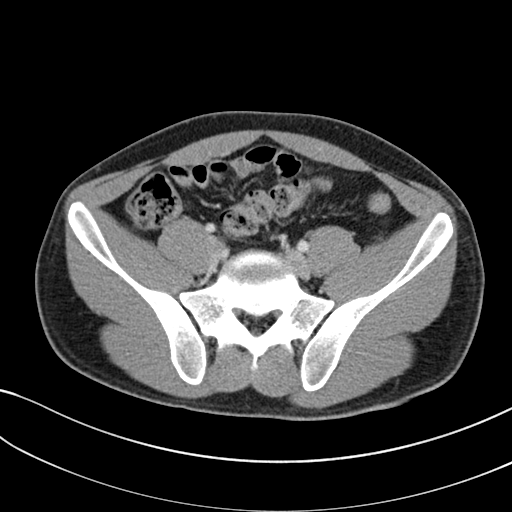
[im 46/101  soft-tissue]
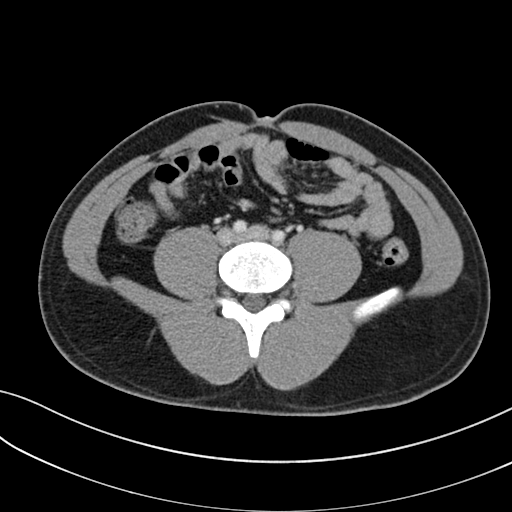
[im 51/101  soft-tissue]
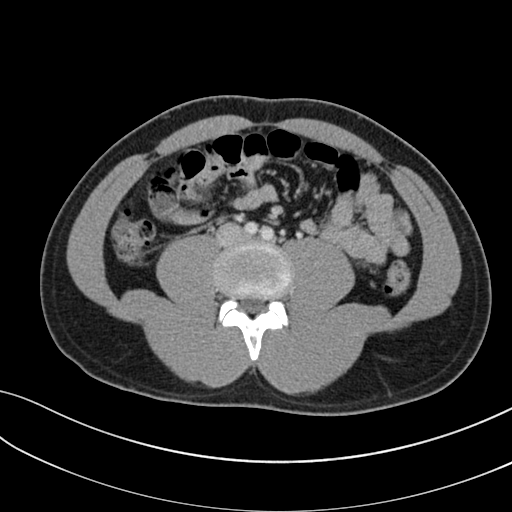
[im 56/101  soft-tissue]
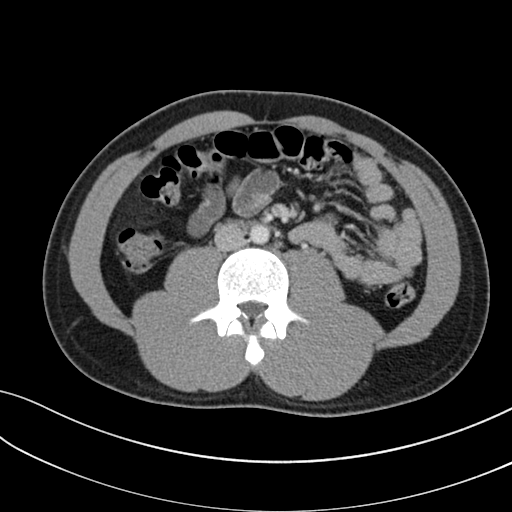
[im 66/101  soft-tissue]
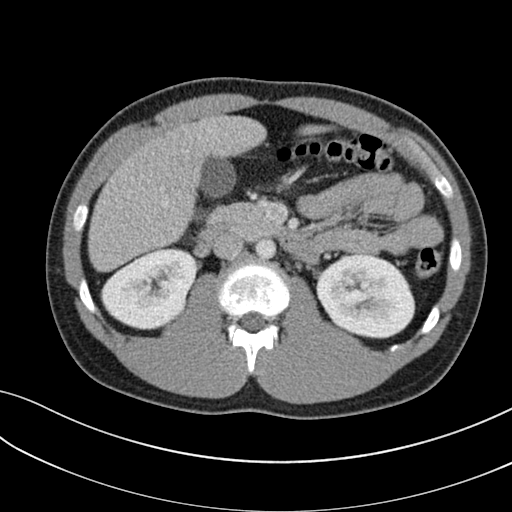
[im 66/101  bone]
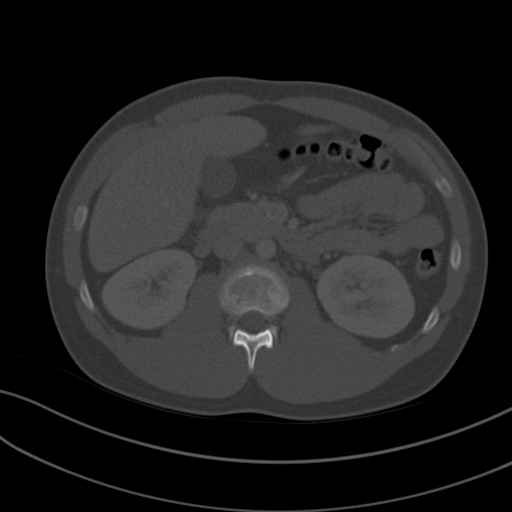
[im 71/101  soft-tissue]
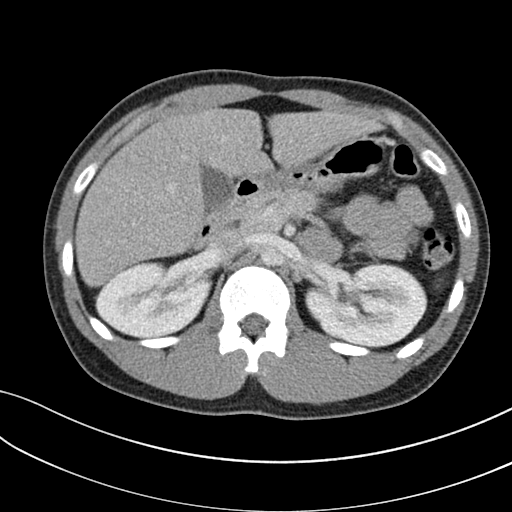
[im 81/101  soft-tissue]
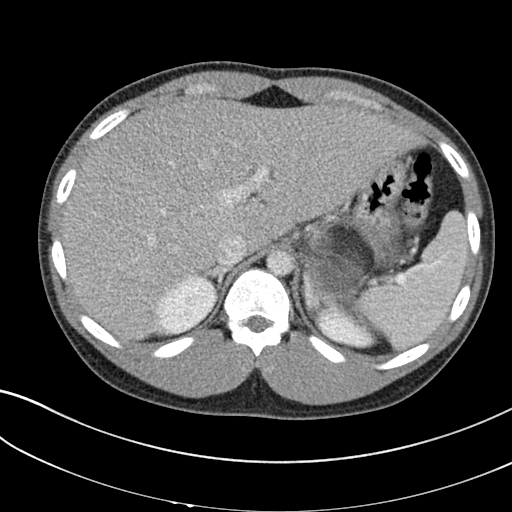
[im 86/101  soft-tissue]
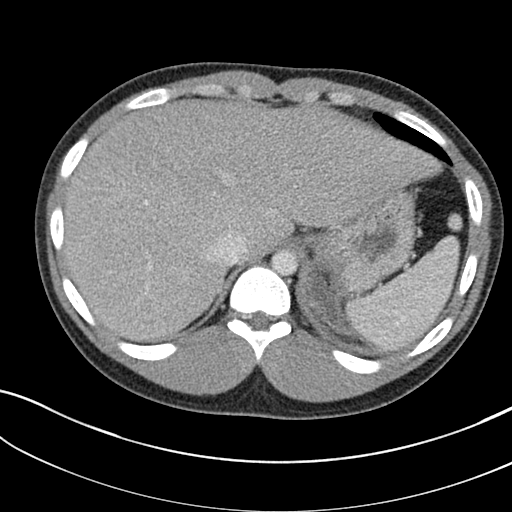
[im 96/101  soft-tissue]
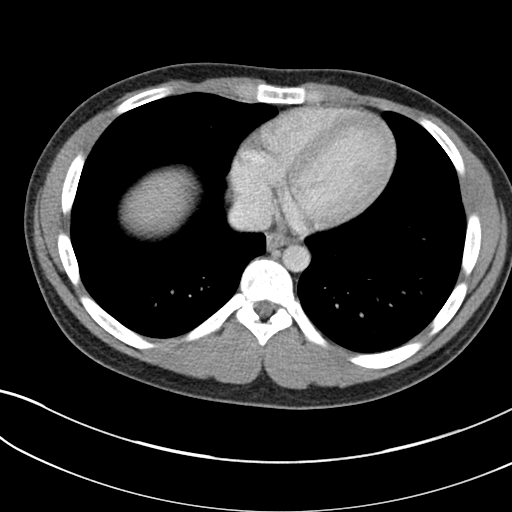

[Series 5: coronal st · coronal · 0.71mm/px · 3 of 130 slices shown]
[im 44/130  soft-tissue]
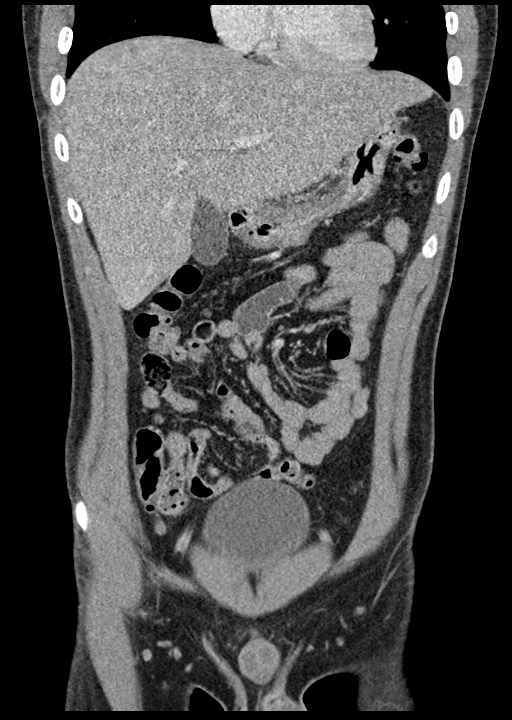
[im 58/130  soft-tissue]
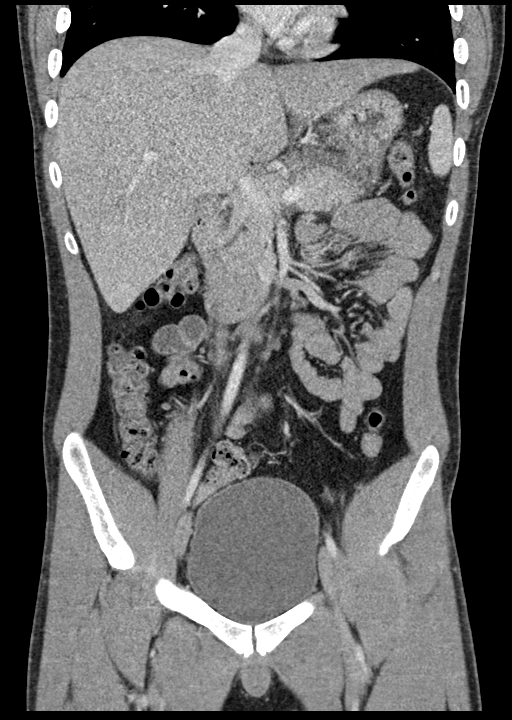
[im 72/130  soft-tissue]
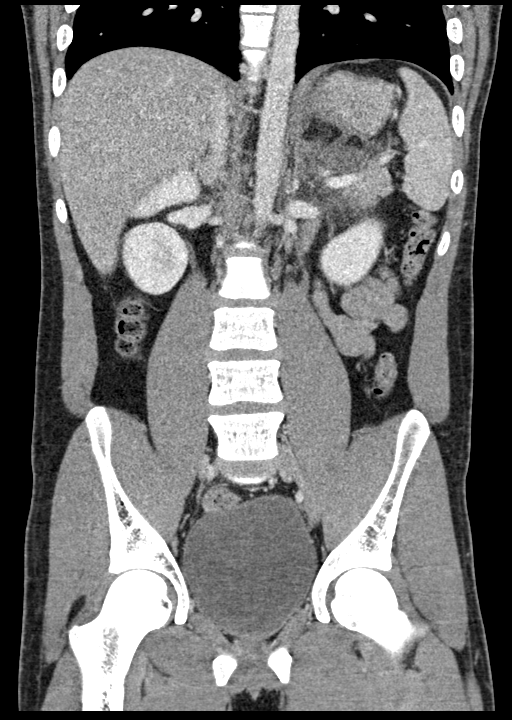

[16 of 46 positions shown; findings below may reference images not displayed]

FINDINGS: Lower chest: No acute abnormality.

Hepatobiliary: No focal liver abnormality is seen. No gallstones,
gallbladder wall thickening, or biliary dilatation. Mild focal fatty
infiltration of liver at the falciform ligament is identified.

Pancreas: Inflammatory change with associated fluid collection is
identified just superior to the pancreatic body and tail with fluid
collection measuring 6 x 4 cm. Small amount of surrounding free
fluid is also identified.

Spleen: Normal in size without focal abnormality.

Adrenals/Urinary Tract: Adrenal glands are unremarkable. Kidneys are
normal, without renal calculi, focal lesion, or hydronephrosis.
Bladder is unremarkable.

Stomach/Bowel: Stomach is within normal limits. Appendix appears
normal. No evidence of bowel wall thickening, distention, or
inflammatory changes.

Vascular/Lymphatic: No significant vascular findings are present. No
enlarged abdominal or pelvic lymph nodes.

Reproductive: Prostate is unremarkable.

Other: None.

Musculoskeletal: No acute abnormality.
IMPRESSION: Findings in the pancreas suggesting pancreatitis with associated
pancreatic pseudocyst and surrounding free fluid. Clinical
correlation is recommended.
